# Patient Record
Sex: Male | Born: 1985 | Race: White | Hispanic: No | Smoking: Current every day smoker
Health system: Southern US, Community
[De-identification: ages and names within clinical notes are randomized; demographics above are authoritative.]

## PROBLEM LIST (undated history)

## (undated) DIAGNOSIS — N200 Calculus of kidney: Secondary | ICD-10-CM

## (undated) DIAGNOSIS — J45909 Unspecified asthma, uncomplicated: Secondary | ICD-10-CM

---

## 2006-01-18 ENCOUNTER — Emergency Department: Payer: Self-pay | Admitting: Emergency Medicine

## 2006-03-28 ENCOUNTER — Emergency Department: Payer: Self-pay | Admitting: Emergency Medicine

## 2007-08-24 ENCOUNTER — Emergency Department: Payer: Self-pay | Admitting: Emergency Medicine

## 2007-12-26 ENCOUNTER — Emergency Department: Payer: Self-pay | Admitting: Emergency Medicine

## 2009-07-31 ENCOUNTER — Emergency Department: Payer: Self-pay | Admitting: Emergency Medicine

## 2010-01-07 ENCOUNTER — Emergency Department: Payer: Self-pay | Admitting: Unknown Physician Specialty

## 2010-01-31 ENCOUNTER — Emergency Department: Payer: Self-pay | Admitting: Internal Medicine

## 2011-09-26 ENCOUNTER — Emergency Department: Payer: Self-pay | Admitting: Emergency Medicine

## 2011-09-26 LAB — CBC
HCT: 46 % (ref 40.0–52.0)
HGB: 15.5 g/dL (ref 13.0–18.0)
MCHC: 33.7 g/dL (ref 32.0–36.0)
MCV: 95 fL (ref 80–100)
Platelet: 256 10*3/uL (ref 150–440)
RBC: 4.84 10*6/uL (ref 4.40–5.90)
WBC: 9.5 10*3/uL (ref 3.8–10.6)

## 2011-09-26 LAB — COMPREHENSIVE METABOLIC PANEL
Albumin: 4.1 g/dL (ref 3.4–5.0)
Alkaline Phosphatase: 67 U/L (ref 50–136)
Anion Gap: 14 (ref 7–16)
BUN: 16 mg/dL (ref 7–18)
Bilirubin,Total: 0.3 mg/dL (ref 0.2–1.0)
Calcium, Total: 8.9 mg/dL (ref 8.5–10.1)
Chloride: 106 mmol/L (ref 98–107)
Creatinine: 1.09 mg/dL (ref 0.60–1.30)
EGFR (Non-African Amer.): >60
Glucose: 126 mg/dL — ABNORMAL HIGH (ref 65–99)
Potassium: 3.3 mmol/L — ABNORMAL LOW (ref 3.5–5.1)
SGOT(AST): 16 U/L (ref 15–37)
SGPT (ALT): 16 U/L
Total Protein: 6.9 g/dL (ref 6.4–8.2)

## 2011-09-26 LAB — LIPASE, BLOOD: Lipase: 80 U/L (ref 73–393)

## 2012-01-03 ENCOUNTER — Emergency Department: Payer: Self-pay | Admitting: Emergency Medicine

## 2014-02-07 ENCOUNTER — Emergency Department: Payer: Self-pay | Admitting: Emergency Medicine

## 2014-06-27 ENCOUNTER — Emergency Department: Payer: Self-pay | Admitting: Emergency Medicine

## 2014-06-27 LAB — CBC WITH DIFFERENTIAL/PLATELET
Basophil #: 0.1 10*3/uL (ref 0.0–0.1)
Basophil %: 0.8 %
EOS PCT: 4.4 %
Eosinophil #: 0.3 10*3/uL (ref 0.0–0.7)
HCT: 44.5 % (ref 40.0–52.0)
HGB: 14.7 g/dL (ref 13.0–18.0)
LYMPHS PCT: 24.1 %
Lymphocyte #: 1.8 10*3/uL (ref 1.0–3.6)
MCH: 31.7 pg (ref 26.0–34.0)
MCHC: 33 g/dL (ref 32.0–36.0)
MCV: 96 fL (ref 80–100)
MONO ABS: 0.6 x10 3/mm (ref 0.2–1.0)
Monocyte %: 7.3 %
Neutrophil #: 4.9 10*3/uL (ref 1.4–6.5)
Neutrophil %: 63.4 %
Platelet: 209 10*3/uL (ref 150–440)
RBC: 4.63 10*6/uL (ref 4.40–5.90)
RDW: 13 % (ref 11.5–14.5)
WBC: 7.6 10*3/uL (ref 3.8–10.6)

## 2014-06-27 LAB — COMPREHENSIVE METABOLIC PANEL
AST: 13 U/L — AB (ref 15–37)
Albumin: 3.8 g/dL (ref 3.4–5.0)
Alkaline Phosphatase: 64 U/L
Anion Gap: 7 (ref 7–16)
BILIRUBIN TOTAL: 0.4 mg/dL (ref 0.2–1.0)
BUN: 14 mg/dL (ref 7–18)
CALCIUM: 8.2 mg/dL — AB (ref 8.5–10.1)
CHLORIDE: 108 mmol/L — AB (ref 98–107)
Co2: 24 mmol/L (ref 21–32)
Creatinine: 0.97 mg/dL (ref 0.60–1.30)
EGFR (African American): >60
EGFR (Non-African Amer.): >60
GLUCOSE: 120 mg/dL — AB (ref 65–99)
Osmolality: 279 (ref 275–301)
Potassium: 4 mmol/L (ref 3.5–5.1)
SGPT (ALT): 16 U/L
Sodium: 139 mmol/L (ref 136–145)
Total Protein: 6.5 g/dL (ref 6.4–8.2)

## 2014-06-27 LAB — LIPASE, BLOOD: Lipase: 108 U/L (ref 73–393)

## 2014-07-24 ENCOUNTER — Emergency Department: Payer: Self-pay | Admitting: Student

## 2014-07-24 LAB — CBC WITH DIFFERENTIAL/PLATELET
BASOS ABS: 0.1 10*3/uL (ref 0.0–0.1)
BASOS PCT: 0.9 %
Eosinophil #: 0.3 10*3/uL (ref 0.0–0.7)
Eosinophil %: 5.1 %
HCT: 45.7 % (ref 40.0–52.0)
HGB: 15.3 g/dL (ref 13.0–18.0)
LYMPHS PCT: 26.7 %
Lymphocyte #: 1.6 10*3/uL (ref 1.0–3.6)
MCH: 31.9 pg (ref 26.0–34.0)
MCHC: 33.5 g/dL (ref 32.0–36.0)
MCV: 95 fL (ref 80–100)
MONO ABS: 0.6 x10 3/mm (ref 0.2–1.0)
Monocyte %: 9.1 %
Neutrophil #: 3.5 10*3/uL (ref 1.4–6.5)
Neutrophil %: 58.2 %
PLATELETS: 193 10*3/uL (ref 150–440)
RBC: 4.8 10*6/uL (ref 4.40–5.90)
RDW: 13.3 % (ref 11.5–14.5)
WBC: 6.1 10*3/uL (ref 3.8–10.6)

## 2014-07-24 LAB — URINALYSIS, COMPLETE
Bilirubin,UR: NEGATIVE
Blood: NEGATIVE
Glucose,UR: NEGATIVE mg/dL (ref 0–75)
KETONE: NEGATIVE
Leukocyte Esterase: NEGATIVE
Nitrite: NEGATIVE
PH: 7 (ref 4.5–8.0)
PROTEIN: NEGATIVE
RBC,UR: 3 /HPF (ref 0–5)
SPECIFIC GRAVITY: 1.023 (ref 1.003–1.030)
Squamous Epithelial: NONE SEEN

## 2014-07-24 LAB — COMPREHENSIVE METABOLIC PANEL
ALT: 14 U/L
AST: 18 U/L (ref 15–37)
Albumin: 3.7 g/dL (ref 3.4–5.0)
Alkaline Phosphatase: 67 U/L
Anion Gap: 6 — ABNORMAL LOW (ref 7–16)
BILIRUBIN TOTAL: 0.5 mg/dL (ref 0.2–1.0)
BUN: 13 mg/dL (ref 7–18)
CHLORIDE: 108 mmol/L — AB (ref 98–107)
CO2: 28 mmol/L (ref 21–32)
CREATININE: 0.89 mg/dL (ref 0.60–1.30)
Calcium, Total: 8.6 mg/dL (ref 8.5–10.1)
Glucose: 86 mg/dL (ref 65–99)
Osmolality: 283 (ref 275–301)
POTASSIUM: 3.9 mmol/L (ref 3.5–5.1)
SODIUM: 142 mmol/L (ref 136–145)
Total Protein: 6.6 g/dL (ref 6.4–8.2)

## 2014-07-24 LAB — LIPASE, BLOOD: LIPASE: 82 U/L (ref 73–393)

## 2014-07-26 LAB — URINE CULTURE

## 2014-10-23 ENCOUNTER — Emergency Department
Admit: 2014-10-23 | Disposition: A | Discharge status: Home / Self Care | Payer: Self-pay | Admitting: Emergency Medicine

## 2015-02-17 ENCOUNTER — Emergency Department: Payer: Managed Care, Other (non HMO)

## 2015-02-17 ENCOUNTER — Emergency Department
Admission: EM | Admit: 2015-02-17 | Discharge: 2015-02-17 | Disposition: A | Discharge status: Home / Self Care | Payer: Managed Care, Other (non HMO) | Attending: Emergency Medicine | Admitting: Emergency Medicine

## 2015-02-17 DIAGNOSIS — M545 Low back pain: Secondary | ICD-10-CM | POA: Insufficient documentation

## 2015-02-17 DIAGNOSIS — R109 Unspecified abdominal pain: Secondary | ICD-10-CM | POA: Diagnosis present

## 2015-02-17 DIAGNOSIS — N23 Unspecified renal colic: Secondary | ICD-10-CM | POA: Insufficient documentation

## 2015-02-17 LAB — URINALYSIS COMPLETE WITH MICROSCOPIC (ARMC ONLY)
BACTERIA UA: NONE SEEN
BILIRUBIN URINE: NEGATIVE
Glucose, UA: NEGATIVE mg/dL
Hgb urine dipstick: NEGATIVE
Leukocytes, UA: NEGATIVE
NITRITE: NEGATIVE
PH: 6 (ref 5.0–8.0)
Protein, ur: 30 mg/dL — AB
Specific Gravity, Urine: 1.032 — ABNORMAL HIGH (ref 1.005–1.030)

## 2015-02-17 LAB — BASIC METABOLIC PANEL
Anion gap: 7 (ref 5–15)
BUN: 21 mg/dL — AB (ref 6–20)
CALCIUM: 8.7 mg/dL — AB (ref 8.9–10.3)
CO2: 28 mmol/L (ref 22–32)
CREATININE: 0.74 mg/dL (ref 0.61–1.24)
Chloride: 107 mmol/L (ref 101–111)
GLUCOSE: 99 mg/dL (ref 65–99)
Potassium: 3.4 mmol/L — ABNORMAL LOW (ref 3.5–5.1)
Sodium: 142 mmol/L (ref 135–145)

## 2015-02-17 LAB — CBC WITH DIFFERENTIAL/PLATELET
BASOS ABS: 0.1 10*3/uL (ref 0–0.1)
Basophils Relative: 1 %
EOS ABS: 0.6 10*3/uL (ref 0–0.7)
Eosinophils Relative: 8 %
HCT: 39.1 % — ABNORMAL LOW (ref 40.0–52.0)
Hemoglobin: 13.7 g/dL (ref 13.0–18.0)
LYMPHS PCT: 39 %
Lymphs Abs: 3 10*3/uL (ref 1.0–3.6)
MCH: 33 pg (ref 26.0–34.0)
MCHC: 35.1 g/dL (ref 32.0–36.0)
MCV: 94 fL (ref 80.0–100.0)
MONO ABS: 0.6 10*3/uL (ref 0.2–1.0)
Monocytes Relative: 8 %
NEUTROS PCT: 44 %
Neutro Abs: 3.3 10*3/uL (ref 1.4–6.5)
Platelets: 196 10*3/uL (ref 150–440)
RBC: 4.15 MIL/uL — ABNORMAL LOW (ref 4.40–5.90)
RDW: 12.9 % (ref 11.5–14.5)
WBC: 7.5 10*3/uL (ref 3.8–10.6)

## 2015-02-17 MED ORDER — TAMSULOSIN HCL 0.4 MG PO CAPS: 0.4000 mg | ORAL_CAPSULE | Freq: Every day | ORAL | Status: DC

## 2015-02-17 MED ORDER — OXYCODONE-ACETAMINOPHEN 5-325 MG PO TABS: 1.0000 | ORAL_TABLET | Freq: Four times a day (QID) | ORAL | Status: DC | PRN

## 2015-02-17 MED ORDER — ONDANSETRON HCL 4 MG PO TABS: 4.0000 mg | ORAL_TABLET | Freq: Every day | ORAL | Status: DC | PRN

## 2015-02-17 NOTE — Discharge Instructions (Signed)

## 2015-02-17 NOTE — ED Notes (Signed)
Right flank pain x4-5 days , last seen at [facility] approx x1 month ago for similar symptoms, hx of kidney stone.

## 2015-02-17 NOTE — ED Notes (Signed)
Patient reports pain to right flank.  With history of kidney stones.  Patient eating chips and drinking soda while waiting and during triage.

## 2015-02-17 NOTE — ED Provider Notes (Signed)
[facility] Emergency Department Provider Note     Time seen: ----------------------------------------- 7:11 AM on 02/17/2015 -----------------------------------------    I have reviewed the triage vital signs and the nursing notes.   HISTORY  Chief Complaint Flank Pain    HPI Jamie Hudson is a 29 y.o. male who presents ER with right flank pain. Patient states he was working and had to stop due to the pain. It's sharp, comes and goes. He is in no acute distress here on arrival. States he was seen at [facility] about a month ago was diagnosed with kidney stones. Patient states the pain earlier was 12/10.   No past medical history on file.  There are no active problems to display for this patient.   No past surgical history on file.  Allergies Review of patient's allergies indicates not on file.  Social History History  Substance Use Topics  . Smoking status: Not on file  . Smokeless tobacco: Not on file  . Alcohol Use: Not on file    Review of Systems Constitutional: Negative for fever. Eyes: Negative for visual changes. ENT: Negative for sore throat. Cardiovascular: Negative for chest pain. Respiratory: Negative for shortness of breath. Gastrointestinal: Positive for abdominal pain Genitourinary: Negative for dysuria. Musculoskeletal: Positive for right-sided low back pain Skin: Negative for rash. Neurological: Negative for headaches, focal weakness or numbness.  10-point ROS otherwise negative.  ____________________________________________   PHYSICAL EXAM:  VITAL SIGNS: ED Triage Vitals  Enc Vitals Group     BP 02/17/15 0312 110/70 mmHg     Pulse Rate 02/17/15 0312 74     Resp 02/17/15 0312 20     Temp 02/17/15 0312 98.4 F (36.9 C)     Temp Source 02/17/15 0312 Oral     SpO2 02/17/15 0312 98 %     Weight 02/17/15 0312 136 lb (61.689 kg)     Height 02/17/15 0312 5\' 9"  (1.753 m)     Head Cir --      Peak Flow --      Pain  Score 02/17/15 0312 6     Pain Loc --      Pain Edu? --      Excl. in GC? --     Constitutional: Alert and oriented. Well appearing and in no distress. Eyes: Conjunctivae are normal. PERRL. Normal extraocular movements. ENT   Head: Normocephalic and atraumatic.   Nose: No congestion/rhinnorhea.   Mouth/Throat: Mucous membranes are moist.   Neck: No stridor. Cardiovascular: Normal rate, regular rhythm. Normal and symmetric distal pulses are present in all extremities. No murmurs, rubs, or gallops. Respiratory: Normal respiratory effort without tachypnea nor retractions. Breath sounds are clear and equal bilaterally. No wheezes/rales/rhonchi. Gastrointestinal: Soft and nontender. No distention. No abdominal bruits.  Musculoskeletal: Nontender with normal range of motion in all extremities. No joint effusions.  No lower extremity tenderness nor edema. Neurologic:  Normal speech and language. No gross focal neurologic deficits are appreciated. Speech is normal. No gait instability. Skin:  Skin is warm, dry and intact. No rash noted. Psychiatric: Mood and affect are normal. Speech and behavior are normal. Patient exhibits appropriate insight and judgment. ____________________________________________  ED COURSE:  Pertinent labs & imaging results that were available during my care of the patient were reviewed by me and considered in my medical decision making (see chart for details). We'll check basic labs, urine and reevaluate. ____________________________________________    LABS (pertinent positives/negatives)  Labs Reviewed  URINALYSIS COMPLETEWITH MICROSCOPIC ([facility] ONLY) -  Abnormal; Notable for the following:    Color, Urine AMBER (*)    APPearance CLEAR (*)    Ketones, ur TRACE (*)    Specific Gravity, Urine 1.032 (*)    Protein, ur 30 (*)    Squamous Epithelial / LPF 0-5 (*)    All other components within normal limits  CBC WITH DIFFERENTIAL/PLATELET - Abnormal;  Notable for the following:    RBC 4.15 (*)    HCT 39.1 (*)    All other components within normal limits  BASIC METABOLIC PANEL - Abnormal; Notable for the following:    Potassium 3.4 (*)    BUN 21 (*)    Calcium 8.7 (*)    All other components within normal limits   IMPRESSION: 1. Possible 2 mm calculus in the right hemipelvis could be within the distal right ureter or right side of the urinary bladder. This could be better evaluated with noncontrast CT scan of the abdomen and pelvis if clinically appropriate. 2. Nonobstructive bowel gas pattern. 3. No pneumoperitoneum. ____________________________________________  FINAL ASSESSMENT AND PLAN  Flank pain  Plan: Patient with labs and imaging as dictated above. Possible renal colic, patient will be discharged with Flomax, Percocet and nausea medicine. We'll refer him to urology for follow-up   Emily Filbert, MD   Emily Filbert, MD 02/17/15 [id number]

## 2015-02-26 ENCOUNTER — Encounter: Payer: Self-pay | Admitting: Urgent Care

## 2015-02-26 ENCOUNTER — Emergency Department
Admission: EM | Admit: 2015-02-26 | Discharge: 2015-02-26 | Disposition: A | Discharge status: Home / Self Care | Payer: Managed Care, Other (non HMO) | Attending: Emergency Medicine | Admitting: Emergency Medicine

## 2015-02-26 DIAGNOSIS — Z720 Tobacco use: Secondary | ICD-10-CM | POA: Diagnosis not present

## 2015-02-26 DIAGNOSIS — Z79899 Other long term (current) drug therapy: Secondary | ICD-10-CM | POA: Diagnosis not present

## 2015-02-26 DIAGNOSIS — R109 Unspecified abdominal pain: Secondary | ICD-10-CM | POA: Diagnosis present

## 2015-02-26 DIAGNOSIS — N201 Calculus of ureter: Secondary | ICD-10-CM | POA: Insufficient documentation

## 2015-02-26 HISTORY — DX: Calculus of kidney: N20.0

## 2015-02-26 HISTORY — DX: Unspecified asthma, uncomplicated: J45.909

## 2015-02-26 LAB — URINALYSIS COMPLETE WITH MICROSCOPIC (ARMC ONLY)
BILIRUBIN URINE: NEGATIVE
Bacteria, UA: NONE SEEN
GLUCOSE, UA: NEGATIVE mg/dL
Hgb urine dipstick: NEGATIVE
KETONES UR: NEGATIVE mg/dL
Leukocytes, UA: NEGATIVE
Nitrite: NEGATIVE
Protein, ur: NEGATIVE mg/dL
SQUAMOUS EPITHELIAL / LPF: NONE SEEN
Specific Gravity, Urine: 1.016 (ref 1.005–1.030)
pH: 7 (ref 5.0–8.0)

## 2015-02-26 MED ORDER — OXYCODONE-ACETAMINOPHEN 5-325 MG PO TABS: 1.0000 | ORAL_TABLET | Freq: Four times a day (QID) | ORAL | Status: DC | PRN

## 2015-02-26 NOTE — ED Provider Notes (Signed)
[facility] Emergency Department Provider Note  Time seen: 8:50 PM  I have reviewed the triage vital signs and the nursing notes.   HISTORY  Chief Complaint Flank Pain    HPI PIER BOSHER is a 29 y.o. male with a past medical history of asthma and kidney stones who presents the emergency department right flank pain. According to the patient he's had intermittent right flank pain for the past 1.5-2 months. Patient was seen at [facility] the beginning of July for the same, with a CT scan showing bilateral kidney stones. He was seen at [facility] approximately one week ago with an x-ray consistent with a 2 mm stone. Patient has an appointment with Dr. Sheppard Penton on 8/26. Patient states he is out of pain medication, and the pain has returned. Denies any fever, dysuria, nausea or vomiting. Patient states this pain feels like his typical kidney stone pain. Describes the pain as moderate to severe at maximum, currently moderate. Sharp/stabbing right flank pain. Denies any modifying factors.     Past Medical History  Diagnosis Date  . Asthma   . Kidney stones     There are no active problems to display for this patient.   History reviewed. No pertinent past surgical history.  Current Outpatient Rx  Name  Route  Sig  Dispense  Refill  . ondansetron (ZOFRAN) 4 MG tablet   Oral   Take 1 tablet (4 mg total) by mouth daily as needed for nausea or vomiting.   20 tablet   1   . oxyCODONE-acetaminophen (ROXICET) 5-325 MG per tablet   Oral   Take 1 tablet by mouth every 6 (six) hours as needed.   20 tablet   0   . tamsulosin (FLOMAX) 0.4 MG CAPS capsule   Oral   Take 1 capsule (0.4 mg total) by mouth daily after breakfast.   30 capsule   0     Allergies Review of patient's allergies indicates no known allergies.  No family history on file.  Social History Social History  Substance Use Topics  . Smoking status: Current Every Day Smoker  .  Smokeless tobacco: None  . Alcohol Use: No    Review of Systems Constitutional: Negative for fever. Cardiovascular: Negative for chest pain. Respiratory: Negative for shortness of breath. Gastrointestinal: Positive right flank pain Genitourinary: Negative for dysuria. Musculoskeletal: Positive right back pain Neurological: Negative for headache 10-point ROS otherwise negative.  ____________________________________________   PHYSICAL EXAM:  VITAL SIGNS: ED Triage Vitals  Enc Vitals Group     BP 02/26/15 1924 110/67 mmHg     Pulse Rate 02/26/15 1924 96     Resp 02/26/15 1924 18     Temp 02/26/15 1924 98.6 F (37 C)     Temp Source 02/26/15 1924 Oral     SpO2 02/26/15 1924 98 %     Weight 02/26/15 1924 140 lb (63.504 kg)     Height 02/26/15 1924 5\' 9"  (1.753 m)     Head Cir --      Peak Flow --      Pain Score 02/26/15 1924 6     Pain Loc --      Pain Edu? --      Excl. in GC? --     Constitutional: Alert and oriented. Well appearing and in no distress. Eyes: Normal exam ENT   Head: Normocephalic and atraumatic Cardiovascular: Normal rate, regular rhythm. No murmurs, rubs, or gallops. Respiratory: Normal respiratory  effort without tachypnea nor retractions. Breath sounds are clear and equal bilaterally. No wheezes/rales/rhonchi. Gastrointestinal: Soft and nontender. No distention.  Moderate CVA tenderness palpation. Musculoskeletal: Nontender with normal range of motion in all extremities. Neurologic:  Normal speech and language. No gross focal neurologic deficits are appreciated. Speech is normal. Skin:  Skin is warm, dry and intact.  Psychiatric: Mood and affect are normal. Speech and behavior are normal. Patient exhibits appropriate insight and judgment.  ____________________________________________     INITIAL IMPRESSION / ASSESSMENT AND PLAN / ED COURSE  Pertinent labs & imaging results that were available during my care of the patient were reviewed by me  and considered in my medical decision making (see chart for details).  Patient with right flank pain consistent with prior stones. Patient had a CT scan on 01/19/15 at [facility] showing bilateral kidney stones. X-ray performed approximately one week ago showing likely 2 mm stone. Patient has an appointment with urology in 1 week. We will discharge the patient home with a short course of pain medication. I discussed with the patient the use of NSAIDs, and only using Percocet as needed for severe pain. The patient is agreeable. Discussed strict return precautions with the patient which she is agreeable.  ____________________________________________   FINAL CLINICAL IMPRESSION(S) / ED DIAGNOSES  Right flank pain Ureterolithiasis   Minna Antis, MD 02/26/15 2053

## 2015-02-26 NOTE — ED Notes (Signed)
Strainer and cup given to patient.

## 2015-02-26 NOTE — ED Notes (Signed)
Patient presents with c/o RIGHT flank pain since yesterday. Denies N/V and dysuria; reports that his urine is "dark" with no gross hematuria appreciated. Patient was here x 3 weeks ago for the same. Has urology appt on the 8/26.

## 2015-02-26 NOTE — Discharge Instructions (Signed)

## 2015-03-09 ENCOUNTER — Encounter: Payer: Self-pay | Admitting: Urology

## 2015-03-09 ENCOUNTER — Ambulatory Visit (INDEPENDENT_AMBULATORY_CARE_PROVIDER_SITE_OTHER): Payer: Managed Care, Other (non HMO) | Admitting: Urology

## 2015-03-09 VITALS — BP 117/80 | HR 77 | Resp 18 | Ht 69.0 in | Wt 138.5 lb

## 2015-03-09 DIAGNOSIS — N200 Calculus of kidney: Secondary | ICD-10-CM | POA: Insufficient documentation

## 2015-03-09 MED ORDER — TAMSULOSIN HCL 0.4 MG PO CAPS: 0.4000 mg | ORAL_CAPSULE | Freq: Every day | ORAL | Status: DC

## 2015-03-09 MED ORDER — OXYCODONE-ACETAMINOPHEN 5-325 MG PO TABS: 1.0000 | ORAL_TABLET | Freq: Four times a day (QID) | ORAL | Status: DC | PRN

## 2015-03-09 NOTE — Progress Notes (Signed)
03/09/2015 11:05 AM   Jamie Hudson 10/21/1985 [id number]  Referring provider: No referring provider defined for this encounter.  Chief Complaint  Patient presents with  . Nephrolithiasis    HPI:  1 - Recurrent Nephrolithiasis -  Pre 2016  Medical passage x many 02/2015 - Rt ureteral stone 2mm by ER KUB on eval right flank pain.   PMH sig for oral surgery. No CV disase.  Today "Jamie Hudson" is seen as eval for new ureteral stone. His pain is controlled and he is on tamuslosin for medical passage. No fevers.        PMH: Past Medical History  Diagnosis Date  . Asthma   . Kidney stones     Surgical History: No past surgical history on file.  Home Medications:    Medication List       This list is accurate as of: 03/09/15 11:05 AM.  Always use your most recent med list.               albuterol 0.63 MG/3ML nebulizer solution  Commonly known as:  ACCUNEB  Inhale 1 ampule by nebulization every six (6) hours as needed for wheezing.     ibuprofen 200 MG tablet  Commonly known as:  ADVIL,MOTRIN  Take 200 mg by mouth every 6 (six) hours as needed.     ondansetron 4 MG tablet  Commonly known as:  ZOFRAN  Take 1 tablet (4 mg total) by mouth daily as needed for nausea or vomiting.     oxyCODONE-acetaminophen 5-325 MG per tablet  Commonly known as:  ROXICET  Take 1 tablet by mouth every 6 (six) hours as needed for moderate pain. From kidney stone     tamsulosin 0.4 MG Caps capsule  Commonly known as:  FLOMAX  Take 1 capsule (0.4 mg total) by mouth daily after breakfast. To help pass kidney stone        Allergies: No Known Allergies  Family History: Family History  Problem Relation Age of Onset  . Cancer Maternal Aunt   . Kidney failure Maternal Grandmother     Social History:  reports that he has been smoking.  He does not have any smokeless tobacco history on file. He reports that he does not drink alcohol. His drug history is not on  file.  ROS: UROLOGY Frequent Urination?: No Hard to postpone urination?: Yes Burning/pain with urination?: Yes Get up at night to urinate?: No Leakage of urine?: No Urine stream starts and stops?: Yes Trouble starting stream?: No Do you have to strain to urinate?: Yes Blood in urine?: No Urinary tract infection?: No Sexually transmitted disease?: No Injury to kidneys or bladder?: No Painful intercourse?: No Weak stream?: No Erection problems?: No Penile pain?: Yes  Gastrointestinal Nausea?: No Vomiting?: No Indigestion/heartburn?: Yes Diarrhea?: No Constipation?: No  Constitutional Fever: No Night sweats?: No Weight loss?: No Fatigue?: Yes  Skin Skin rash/lesions?: No Itching?: Yes  Eyes Blurred vision?: No Double vision?: No  Ears/Nose/Throat Sore throat?: No Sinus problems?: Yes  Hematologic/Lymphatic Swollen glands?: No Easy bruising?: No  Cardiovascular Leg swelling?: No Chest pain?: Yes  Respiratory Cough?: No Shortness of breath?: No  Endocrine Excessive thirst?: No  Musculoskeletal Back pain?: Yes Joint pain?: Yes  Neurological Headaches?: Yes Dizziness?: No  Psychologic Depression?: No Anxiety?: No  Physical Exam: BP 117/80 mmHg  Pulse 77  Resp 18  Ht  (1.753 m)  Wt 138 lb 8 oz (62.823 kg)  BMI 20.44 kg/m2  Constitutional:  Alert and oriented, No acute distress. HEENT: [place] AT, moist mucus membranes.  Trachea midline, no masses. Cardiovascular: No clubbing, cyanosis, or edema. Respiratory: Normal respiratory effort, no increased work of breathing. GI: Abdomen is soft, nontender, nondistended, no abdominal masses GU: Very mild Rt CVAT.  Skin: No rashes, bruises or suspicious lesions. Lymph: No cervical or inguinal adenopathy. Neurologic: Grossly intact, no focal deficits, moving all 4 extremities. Psychiatric: Normal mood and affect.  Laboratory Data: Lab Results  Component Value Date   WBC 7.5 02/17/2015   HGB  13.7 02/17/2015   HCT 39.1* 02/17/2015   MCV 94.0 02/17/2015   PLT 196 02/17/2015    Lab Results  Component Value Date   CREATININE 0.74 02/17/2015    No results found for: PSA  No results found for: TESTOSTERONE  No results found for: HGBA1C  Urinalysis    Component Value Date/Time   COLORURINE YELLOW* 02/26/2015 1927   COLORURINE Yellow 07/24/2014 1301   APPEARANCEUR CLEAR* 02/26/2015 1927   APPEARANCEUR Cloudy 07/24/2014 1301   LABSPEC 1.016 02/26/2015 1927   LABSPEC 1.023 07/24/2014 1301   PHURINE 7.0 02/26/2015 1927   PHURINE 7.0 07/24/2014 1301   GLUCOSEU NEGATIVE 02/26/2015 1927   GLUCOSEU Negative 07/24/2014 1301   HGBUR NEGATIVE 02/26/2015 1927   HGBUR Negative 07/24/2014 1301   BILIRUBINUR NEGATIVE 02/26/2015 1927   BILIRUBINUR Negative 07/24/2014 1301   KETONESUR NEGATIVE 02/26/2015 1927   KETONESUR Negative 07/24/2014 1301   PROTEINUR NEGATIVE 02/26/2015 1927   PROTEINUR Negative 07/24/2014 1301   NITRITE NEGATIVE 02/26/2015 1927   NITRITE Negative 07/24/2014 1301   LEUKOCYTESUR NEGATIVE 02/26/2015 1927   LEUKOCYTESUR Negative 07/24/2014 1301    Pertinent Imaging:   Assessment & Plan:    1. Kidney stones - minimal stone burden and pain controlled. Rec continue medical therapy and he is i nagreement. Refilled tamsulosin and percocet today. He understands to contact MD if fevers arise.  2 - RTC 1 mo with KUB or sooner if refractory colic.    No Follow-up on file.  Sebastian Ache, MD  [facility] [address] [place], [place] [address] [phone]

## 2015-03-12 ENCOUNTER — Telehealth: Payer: Self-pay

## 2015-03-12 NOTE — Telephone Encounter (Signed)
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 Per Lillia Abed pt can return to work but can not take pain medications while at work. HR voiced understanding.

## 2015-03-20 ENCOUNTER — Telehealth: Payer: Self-pay | Admitting: Urology

## 2015-03-20 NOTE — Telephone Encounter (Signed)
Patient requested FMLA paperwork to be completed for his employer.  Dr. Berneice Heinrich advised that the patient does not need to be out of work and will not complete FMLA paperwork.  Patient has been advised.

## 2015-03-28 ENCOUNTER — Encounter: Payer: Self-pay | Admitting: Emergency Medicine

## 2015-03-28 ENCOUNTER — Emergency Department
Admission: EM | Admit: 2015-03-28 | Discharge: 2015-03-28 | Disposition: A | Discharge status: Home / Self Care | Payer: Managed Care, Other (non HMO) | Attending: Emergency Medicine | Admitting: Emergency Medicine

## 2015-03-28 DIAGNOSIS — Y9289 Other specified places as the place of occurrence of the external cause: Secondary | ICD-10-CM | POA: Insufficient documentation

## 2015-03-28 DIAGNOSIS — Z79899 Other long term (current) drug therapy: Secondary | ICD-10-CM | POA: Insufficient documentation

## 2015-03-28 DIAGNOSIS — Y998 Other external cause status: Secondary | ICD-10-CM | POA: Insufficient documentation

## 2015-03-28 DIAGNOSIS — Y9389 Activity, other specified: Secondary | ICD-10-CM | POA: Insufficient documentation

## 2015-03-28 DIAGNOSIS — S50862A Insect bite (nonvenomous) of left forearm, initial encounter: Secondary | ICD-10-CM | POA: Diagnosis present

## 2015-03-28 DIAGNOSIS — W57XXXA Bitten or stung by nonvenomous insect and other nonvenomous arthropods, initial encounter: Secondary | ICD-10-CM | POA: Insufficient documentation

## 2015-03-28 DIAGNOSIS — Z720 Tobacco use: Secondary | ICD-10-CM | POA: Diagnosis not present

## 2015-03-28 NOTE — Discharge Instructions (Signed)
Insect Bite Mosquitoes, flies, fleas, bedbugs, and other insects can bite. Insect bites are different from insect stings. The bite may be red, puffy (swollen), and itchy for 2 to 4 days. Most bites get better on their own. HOME CARE   Do not scratch the bite.  Keep the bite clean and dry. Wash the bite with soap and water.  Put ice on the bite.  Put ice in a plastic bag.  Place a towel between your skin and the bag.  Leave the ice on for 20 minutes, 4 times a day. Do this for the first 2 to 3 days, or as told by your doctor.  You may use medicated lotions or creams to lessen itching as told by your doctor.  Only take medicines as told by your doctor.  If you are given medicines (antibiotics), take them as told. Finish them even if you start to feel better. You may need a tetanus shot if:  You cannot remember when you had your last tetanus shot.  You have never had a tetanus shot.  The injury broke your skin. If you need a tetanus shot and you choose not to have one, you may get tetanus. Sickness from tetanus can be serious. GET HELP RIGHT AWAY IF:   You have more pain, redness, or puffiness.  You see a red line on the skin coming from the bite.  You have a fever.  You have joint pain.  You have a headache or neck pain.  You feel weak.  You have a rash.  You have chest pain, or you are short of breath.  You have belly (abdominal) pain.  You feel sick to your stomach (nauseous) or throw up (vomit).  You feel very tired or sleepy. MAKE SURE YOU:   Understand these instructions.  Will watch your condition.  Will get help right away if you are not doing well or get worse. Document Released: 06/27/2000 Document Revised: 09/22/2011 Document Reviewed: 01/29/2011 ExitCare Patient Information 2015 [employer]. This information is not intended to replace advice given to you by your health care provider. Make sure you discuss any questions you have with your health  care provider.  

## 2015-03-28 NOTE — ED Notes (Signed)
Patient to ED with report of spider bite to left forearm. Patient reports some arm pain since then.

## 2015-03-28 NOTE — ED Provider Notes (Signed)
[facility] Emergency Department Provider Note ____________________________________________  Time seen: 1750  I have reviewed the triage vital signs and the nursing notes.  HISTORY  Chief Complaint  Insect Bite  HPI Jamie Hudson is a 29 y.o. male reports to the ED for evaluation of a spider bite left forearm. 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 Since that time he has applied antibiotic ointment and taken some leftover antibiotics. He has no complaint at this time except some intermittent itching to the site. He denies any fever, chills, sweats, or distal paresthesias. He is without any indication of infection to the bite site.  Past Medical History  Diagnosis Date  . Asthma   . Kidney stones     Patient Active Problem List   Diagnosis Date Noted  . Recurrent nephrolithiasis 03/09/2015    History reviewed. No pertinent past surgical history.  Current Outpatient Rx  Name  Route  Sig  Dispense  Refill  . albuterol (ACCUNEB) 0.63 MG/3ML nebulizer solution      Inhale 1 ampule by nebulization every six (6) hours as needed for wheezing.         [name] ibuprofen (ADVIL,MOTRIN) 200 MG tablet   Oral   Take 200 mg by mouth every 6 (six) hours as needed.         . ondansetron (ZOFRAN) 4 MG tablet   Oral   Take 1 tablet (4 mg total) by mouth daily as needed for nausea or vomiting.   20 tablet   1   . oxyCODONE-acetaminophen (ROXICET) 5-325 MG per tablet   Oral   Take 1 tablet by mouth every 6 (six) hours as needed for moderate pain. From kidney stone   40 tablet   0   . tamsulosin (FLOMAX) 0.4 MG CAPS capsule   Oral   Take 1 capsule (0.4 mg total) by mouth daily after breakfast. To help pass kidney stone   30 capsule   1    Allergies Review of patient's allergies indicates no known allergies.  Family History  Problem Relation Age of Onset  . Cancer Maternal Aunt   . Kidney failure  Maternal Grandmother     Social History Social History  Substance Use Topics  . Smoking status: Current Every Day Smoker  . Smokeless tobacco: None  . Alcohol Use: No   Review of Systems  Constitutional: Negative for fever. Eyes: Negative for visual changes. ENT: Negative for sore throat. Cardiovascular: Negative for chest pain. Respiratory: Negative for shortness of breath. Gastrointestinal: Negative for abdominal pain, vomiting and diarrhea. Genitourinary: Negative for dysuria. Musculoskeletal: Negative for back pain. Skin: Negative for rash. Insect bite as above Neurological: Negative for headaches, focal weakness or numbness. ____________________________________________  PHYSICAL EXAM:  VITAL SIGNS: ED Triage Vitals  Enc Vitals Group     BP 03/28/15 1623 112/70 mmHg     Pulse Rate 03/28/15 1623 106     Resp 03/28/15 1623 20     Temp 03/28/15 1623 98.4 F (36.9 C)     Temp Source 03/28/15 1623 Oral     SpO2 03/28/15 1623 97 %     Weight 03/28/15 1623 130 lb (58.968 kg)     Height 03/28/15 1623  (1.753 m)     Head Cir --      Peak Flow --      Pain Score 03/28/15 1623 5     Pain Loc --  Pain Edu? --      Excl. in GC? --    Constitutional: Alert and oriented. Well appearing and in no distress. Eyes: Conjunctivae are normal. PERRL. Normal extraocular movements. ENT   Head: Normocephalic and atraumatic.   Nose: No congestion/rhinorrhea.   Mouth/Throat: Mucous membranes are moist.   Neck: Supple. No thyromegaly. Hematological/Lymphatic/Immunological: No cervical lymphadenopathy. Cardiovascular: Normal rate, regular rhythm.  Respiratory: Normal respiratory effort. No wheezes/rales/rhonchi. Gastrointestinal: Soft and nontender. No distention. Musculoskeletal: Nontender with normal range of motion in all extremities.  Neurologic:  Normal gait without ataxia. Normal speech and language. No gross focal neurologic deficits are appreciated. Skin:   Skin is warm, dry and intact. No rash noted. Single erythematous papule to the left forearm without signs of infection, lymphangitis, induration, excoriations, or puncture.  Psychiatric: Mood and affect are normal. Patient exhibits appropriate insight and judgment. ____________________________________________  INITIAL IMPRESSION / ASSESSMENT AND PLAN / ED COURSE  Uninfected spider bite without local skin changes or signs of allergic reaction. Reassurance to the patient about treatment with topical cortisone cream. Our primary care provider as needed. ____________________________________________  FINAL CLINICAL IMPRESSION(S) / ED DIAGNOSES  Final diagnoses:  Insect bite      Lissa Hoard, PA-C 03/28/15 1855  Minna Antis, MD 03/28/15 2056

## 2015-04-10 ENCOUNTER — Encounter: Payer: Self-pay | Admitting: Urology

## 2015-04-10 ENCOUNTER — Ambulatory Visit: Payer: Managed Care, Other (non HMO) | Admitting: Urology

## 2015-05-26 ENCOUNTER — Emergency Department
Admission: EM | Admit: 2015-05-26 | Discharge: 2015-05-26 | Disposition: A | Discharge status: Home / Self Care | Payer: Managed Care, Other (non HMO) | Attending: Emergency Medicine | Admitting: Emergency Medicine

## 2015-05-26 ENCOUNTER — Emergency Department: Payer: Managed Care, Other (non HMO)

## 2015-05-26 DIAGNOSIS — Z720 Tobacco use: Secondary | ICD-10-CM | POA: Insufficient documentation

## 2015-05-26 DIAGNOSIS — N201 Calculus of ureter: Secondary | ICD-10-CM | POA: Insufficient documentation

## 2015-05-26 LAB — URINALYSIS COMPLETE WITH MICROSCOPIC (ARMC ONLY)
BACTERIA UA: NONE SEEN
Bilirubin Urine: NEGATIVE
GLUCOSE, UA: NEGATIVE mg/dL
KETONES UR: NEGATIVE mg/dL
Leukocytes, UA: NEGATIVE
NITRITE: NEGATIVE
Protein, ur: NEGATIVE mg/dL
SPECIFIC GRAVITY, URINE: 1.013 (ref 1.005–1.030)
pH: 9 — ABNORMAL HIGH (ref 5.0–8.0)

## 2015-05-26 LAB — CBC WITH DIFFERENTIAL/PLATELET
Basophils Absolute: 0.1 10*3/uL (ref 0–0.1)
Basophils Relative: 1 %
EOS ABS: 0.1 10*3/uL (ref 0–0.7)
EOS PCT: 0 %
HCT: 43 % (ref 40.0–52.0)
Hemoglobin: 14.6 g/dL (ref 13.0–18.0)
LYMPHS ABS: 1.1 10*3/uL (ref 1.0–3.6)
Lymphocytes Relative: 7 %
MCH: 31.2 pg (ref 26.0–34.0)
MCHC: 33.9 g/dL (ref 32.0–36.0)
MCV: 92 fL (ref 80.0–100.0)
MONO ABS: 1 10*3/uL (ref 0.2–1.0)
Monocytes Relative: 6 %
Neutro Abs: 15.1 10*3/uL — ABNORMAL HIGH (ref 1.4–6.5)
Neutrophils Relative %: 86 %
Platelets: 244 10*3/uL (ref 150–440)
RBC: 4.67 MIL/uL (ref 4.40–5.90)
RDW: 12.4 % (ref 11.5–14.5)
WBC: 17.3 10*3/uL — ABNORMAL HIGH (ref 3.8–10.6)

## 2015-05-26 LAB — COMPREHENSIVE METABOLIC PANEL
ALT: 12 U/L — ABNORMAL LOW (ref 17–63)
ANION GAP: 10 (ref 5–15)
AST: 21 U/L (ref 15–41)
Albumin: 4.6 g/dL (ref 3.5–5.0)
Alkaline Phosphatase: 51 U/L (ref 38–126)
BUN: 15 mg/dL (ref 6–20)
CALCIUM: 9.6 mg/dL (ref 8.9–10.3)
CHLORIDE: 103 mmol/L (ref 101–111)
CO2: 24 mmol/L (ref 22–32)
CREATININE: 0.89 mg/dL (ref 0.61–1.24)
GFR calc Af Amer: >60 mL/min (ref >60)
Glucose, Bld: 115 mg/dL — ABNORMAL HIGH (ref 65–99)
Potassium: 3.8 mmol/L (ref 3.5–5.1)
SODIUM: 137 mmol/L (ref 135–145)
Total Bilirubin: 1 mg/dL (ref 0.3–1.2)
Total Protein: 7.5 g/dL (ref 6.5–8.1)

## 2015-05-26 LAB — LIPASE, BLOOD: LIPASE: 21 U/L (ref 11–51)

## 2015-05-26 MED ORDER — TAMSULOSIN HCL 0.4 MG PO CAPS: 0.4000 mg | ORAL_CAPSULE | Freq: Every day | ORAL | Status: DC

## 2015-05-26 MED ORDER — SODIUM CHLORIDE 0.9 % IV BOLUS (SEPSIS): 1000.0000 mL | Freq: Once | INTRAVENOUS | Status: DC

## 2015-05-26 MED ORDER — SODIUM CHLORIDE 0.9 % IV BOLUS (SEPSIS)
1000.0000 mL | Freq: Once | INTRAVENOUS | Status: AC
  Administered 2015-05-26: 1000 mL via INTRAVENOUS

## 2015-05-26 MED ORDER — ONDANSETRON HCL 4 MG/2ML IJ SOLN
4.0000 mg | Freq: Once | INTRAMUSCULAR | Status: AC
  Administered 2015-05-26: 4 mg via INTRAVENOUS
  Filled 2015-05-26: qty 2

## 2015-05-26 MED ORDER — KETOROLAC TROMETHAMINE 30 MG/ML IJ SOLN
30.0000 mg | Freq: Once | INTRAMUSCULAR | Status: AC
  Administered 2015-05-26: 30 mg via INTRAVENOUS
  Filled 2015-05-26: qty 1

## 2015-05-26 MED ORDER — OXYCODONE-ACETAMINOPHEN 5-325 MG PO TABS: 1.0000 | ORAL_TABLET | Freq: Four times a day (QID) | ORAL | Status: DC | PRN

## 2015-05-26 MED ORDER — MORPHINE SULFATE (PF) 4 MG/ML IV SOLN
4.0000 mg | Freq: Once | INTRAVENOUS | Status: AC
  Administered 2015-05-26: 4 mg via INTRAVENOUS
  Filled 2015-05-26: qty 1

## 2015-05-26 MED ORDER — TAMSULOSIN HCL 0.4 MG PO CAPS
0.4000 mg | ORAL_CAPSULE | Freq: Once | ORAL | Status: AC
  Administered 2015-05-26: 0.4 mg via ORAL
  Filled 2015-05-26: qty 1

## 2015-05-26 NOTE — ED Notes (Signed)
Patient transported to Ultrasound 

## 2015-05-26 NOTE — ED Provider Notes (Signed)
[facility] Emergency Department Provider Note  ____________________________________________  Time seen: Approximately 515 AM  I have reviewed the triage vital signs and the nursing notes.   HISTORY  Chief Complaint Flank Pain    HPI Jamie Hudson is a 29 y.o. male with a history of kidney stones who is presenting today with left-sided flank pain that started at 11 PM yesterday. He said that he was coming home in a car when he had sudden onset sharp left sided flank pain. He said that his urine has since been dark. He has had nausea and vomiting as well as 1 episode of diarrhea since the event. He says that he had similar symptoms with a previous kidney stone was seen in [facility] neurologic. He did not need any intervention and was able to pass the stone with Flomax.   Past Medical History  Diagnosis Date  . Asthma   . Kidney stones     Patient Active Problem List   Diagnosis Date Noted  . Recurrent nephrolithiasis 03/09/2015    No past surgical history on file.  Current Outpatient Rx  Name  Route  Sig  Dispense  Refill  . albuterol (ACCUNEB) 0.63 MG/3ML nebulizer solution      Inhale 1 ampule by nebulization every six (6) hours as needed for wheezing.         [name] ibuprofen (ADVIL,MOTRIN) 200 MG tablet   Oral   Take 200 mg by mouth every 6 (six) hours as needed.         . ondansetron (ZOFRAN) 4 MG tablet   Oral   Take 1 tablet (4 mg total) by mouth daily as needed for nausea or vomiting.   20 tablet   1   . oxyCODONE-acetaminophen (ROXICET) 5-325 MG per tablet   Oral   Take 1 tablet by mouth every 6 (six) hours as needed for moderate pain. From kidney stone   40 tablet   0   . tamsulosin (FLOMAX) 0.4 MG CAPS capsule   Oral   Take 1 capsule (0.4 mg total) by mouth daily after breakfast. To help pass kidney stone   30 capsule   1     Allergies Review of patient's allergies indicates no known allergies.  Family History   Problem Relation Age of Onset  . Cancer Maternal Aunt   . Kidney failure Maternal Grandmother     Social History Social History  Substance Use Topics  . Smoking status: Current Every Day Smoker -- 1.00 packs/day for 15 years    Types: Cigarettes  . Smokeless tobacco: None  . Alcohol Use: No    Review of Systems Constitutional: No fever/chills Eyes: No visual changes. ENT: No sore throat. Cardiovascular: Denies chest pain. Respiratory: Denies shortness of breath. Gastrointestinal:  No constipation. Genitourinary: Negative for dysuria. Musculoskeletal: Negative for back pain. Skin: Negative for rash. Neurological: Negative for headaches, focal weakness or numbness.  10-point ROS otherwise negative.  ____________________________________________   PHYSICAL EXAM:  VITAL SIGNS: ED Triage Vitals  Enc Vitals Group     BP 05/26/15 0459 110/76 mmHg     Pulse Rate 05/26/15 0459 113     Resp 05/26/15 0459 24     Temp 05/26/15 0459 98.7 F (37.1 C)     Temp Source 05/26/15 0459 Oral     SpO2 05/26/15 0459 97 %     Weight 05/26/15 0459 140 lb (63.504 kg)     Height 05/26/15 0459  (1.753 m)  Head Cir --      Peak Flow --      Pain Score 05/26/15 0500 10     Pain Loc --      Pain Edu? --      Excl. in GC? --     Constitutional: Alert and oriented. Mild to moderate distress. Appears to not be able to find a comfortable position on the gurney. Eyes: Conjunctivae are normal. PERRL. EOMI. Head: Atraumatic. Nose: No congestion/rhinnorhea. Mouth/Throat: Mucous membranes are moist.   Neck: No stridor.   Cardiovascular: Normal rate, regular rhythm. Grossly normal heart sounds.  Good peripheral circulation. Respiratory: Normal respiratory effort.  No retractions. Lungs CTAB. Gastrointestinal: SWith mild left lower quadrant tenderness No distention. No abdominal bruits.  Left CVA tenderness to palpation.  Musculoskeletal: No lower extremity tenderness nor edema.  No joint  effusions. Neurologic:  Normal speech and language. No gross focal neurologic deficits are appreciated. No gait instability. Skin:  Skin is warm, dry and intact. No rash noted. Psychiatric: Mood and affect are normal. Speech and behavior are normal.  ____________________________________________   LABS (all labs ordered are listed, but only abnormal results are displayed)  Labs Reviewed  URINALYSIS COMPLETEWITH MICROSCOPIC ([facility] ONLY) - Abnormal; Notable for the following:    Color, Urine YELLOW (*)    APPearance CLEAR (*)    Hgb urine dipstick 2+ (*)    pH 9.0 (*)    Squamous Epithelial / LPF 0-5 (*)    All other components within normal limits  CBC WITH DIFFERENTIAL/PLATELET - Abnormal; Notable for the following:    WBC 17.3 (*)    Neutro Abs 15.1 (*)    All other components within normal limits  COMPREHENSIVE METABOLIC PANEL - Abnormal; Notable for the following:    Glucose, Bld 115 (*)    ALT 12 (*)    All other components within normal limits  LIPASE, BLOOD   ____________________________________________  EKG   ____________________________________________  RADIOLOGY  4 mm stone at the left UVJ with associated mild to moderate left hydronephrosis ____________________________________________   PROCEDURES    ____________________________________________   INITIAL IMPRESSION / ASSESSMENT AND PLAN / ED COURSE  Pertinent labs & imaging results that were available during my care of the patient were reviewed by me and considered in my medical decision making (see chart for details).  ----------------------------------------- 7:01 AM on 05/26/2015 -----------------------------------------  Patient is resting comfortably now after pain medication. I retook the patient's temperature which was 98.3. The patient says that he still has his urine strainer at home from his last kidney stone. I advised him to return immediately for any worsening or concerning symptoms such  as belly or flank pain, nausea vomiting or fever. The patient understands the plan is one to comply. He also understands that he must [facility] for a follow-up appointment to be seen within 7 days. He'll be given pain medication as well as Flomax at home tell passage of the stone. The patient does have an elevated white count but I think this is a appropriate given the clinical scenario. He is no longer tachycardic or tachypneic and I do not believe that he is septic at this time from his pathology. His urine does not appear infected. ____________________________________________   FINAL CLINICAL IMPRESSION(S) / ED DIAGNOSES  Left-sided ureterolithiasis.    Myrna Blazeravid Matthew Jleigh Striplin, MD 05/26/15 [phone]

## 2015-05-26 NOTE — Discharge Instructions (Signed)
Kidney Stones °Kidney stones (urolithiasis) are deposits that form inside your kidneys. The intense pain is caused by the stone moving through the urinary tract. When the stone moves, the ureter goes into spasm around the stone. The stone is usually passed in the urine.  °CAUSES  °· A disorder that makes certain neck glands produce too much parathyroid hormone (primary hyperparathyroidism). °· A buildup of uric acid crystals, similar to gout in your joints. °· Narrowing (stricture) of the ureter. °· A kidney obstruction present at birth (congenital obstruction). °· Previous surgery on the kidney or ureters. °· Numerous kidney infections. °SYMPTOMS  °· Feeling sick to your stomach (nauseous). °· Throwing up (vomiting). °· Blood in the urine (hematuria). °· Pain that usually spreads (radiates) to the groin. °· Frequency or urgency of urination. °DIAGNOSIS  °· Taking a history and physical exam. °· Blood or urine tests. °· CT scan. °· Occasionally, an examination of the inside of the urinary bladder (cystoscopy) is performed. °TREATMENT  °· Observation. °· Increasing your fluid intake. °· Extracorporeal shock wave lithotripsy--This is a noninvasive procedure that uses shock waves to break up kidney stones. °· Surgery may be needed if you have severe pain or persistent obstruction. There are various surgical procedures. Most of the procedures are performed with the use of small instruments. Only small incisions are needed to accommodate these instruments, so recovery time is minimized. °The size, location, and chemical composition are all important variables that will determine the proper choice of action for you. Talk to your health care provider to better understand your situation so that you will minimize the risk of injury to yourself and your kidney.  °HOME CARE INSTRUCTIONS  °· Drink enough water and fluids to keep your urine clear or pale yellow. This will help you to pass the stone or stone fragments. °· Strain  all urine through the provided strainer. Keep all particulate matter and stones for your health care provider to see. The stone causing the pain may be as small as a grain of salt. It is very important to use the strainer each and every time you pass your urine. The collection of your stone will allow your health care provider to analyze it and verify that a stone has actually passed. The stone analysis will often identify what you can do to reduce the incidence of recurrences. °· Only take over-the-counter or prescription medicines for pain, discomfort, or fever as directed by your health care provider. °· Keep all follow-up visits as told by your health care provider. This is important. °· Get follow-up X-rays if required. The absence of pain does not always mean that the stone has passed. It may have only stopped moving. If the urine remains completely obstructed, it can cause loss of kidney function or even complete destruction of the kidney. It is your responsibility to make sure X-rays and follow-ups are completed. Ultrasounds of the kidney can show blockages and the status of the kidney. Ultrasounds are not associated with any radiation and can be performed easily in a matter of minutes. °· Make changes to your daily diet as told by your health care provider. You may be told to: °¨ Limit the amount of salt that you eat. °¨ Eat 5 or more servings of fruits and vegetables each day. °¨ Limit the amount of meat, poultry, fish, and eggs that you eat. °· Collect a 24-hour urine sample as told by your health care provider. You may need to collect another urine sample every 6-12   months. °SEEK MEDICAL CARE IF: °· You experience pain that is progressive and unresponsive to any pain medicine you have been prescribed. °SEEK IMMEDIATE MEDICAL CARE IF:  °· Pain cannot be controlled with the prescribed medicine. °· You have a fever or shaking chills. °· The severity or intensity of pain increases over 18 hours and is not  relieved by pain medicine. °· You develop a new onset of abdominal pain. °· You feel faint or pass out. °· You are unable to urinate. °  °This information is not intended to replace advice given to you by your health care provider. Make sure you discuss any questions you have with your health care provider. °  °Document Released: 06/30/2005 Document Revised: 03/21/2015 Document Reviewed: 12/01/2012 °Elsevier Interactive Patient Education ©2016 Elsevier Inc. ° °

## 2015-05-26 NOTE — ED Notes (Signed)
Pt presents with c/o left flank pain onset 11pm Friday, radiates to left groin.  Pt states pain is constant and throbbing.  +nausea, vomiting, and 1 episode of diarrhea.  States he is unable to void, last voided 8 pm Friday.  Hx kidney stones and he feels that is what this is.

## 2015-06-27 ENCOUNTER — Encounter: Payer: Self-pay | Admitting: Emergency Medicine

## 2015-06-27 ENCOUNTER — Emergency Department
Admission: EM | Admit: 2015-06-27 | Discharge: 2015-06-27 | Disposition: A | Discharge status: Home / Self Care | Payer: Managed Care, Other (non HMO) | Attending: Emergency Medicine | Admitting: Emergency Medicine

## 2015-06-27 DIAGNOSIS — Z79899 Other long term (current) drug therapy: Secondary | ICD-10-CM | POA: Insufficient documentation

## 2015-06-27 DIAGNOSIS — F17210 Nicotine dependence, cigarettes, uncomplicated: Secondary | ICD-10-CM | POA: Insufficient documentation

## 2015-06-27 DIAGNOSIS — J45909 Unspecified asthma, uncomplicated: Secondary | ICD-10-CM | POA: Insufficient documentation

## 2015-06-27 DIAGNOSIS — J40 Bronchitis, not specified as acute or chronic: Secondary | ICD-10-CM

## 2015-06-27 DIAGNOSIS — J069 Acute upper respiratory infection, unspecified: Secondary | ICD-10-CM | POA: Insufficient documentation

## 2015-06-27 MED ORDER — AZITHROMYCIN 250 MG PO TABS: ORAL_TABLET | ORAL | Status: DC

## 2015-06-27 MED ORDER — FLUTICASONE PROPIONATE 50 MCG/ACT NA SUSP: 1.0000 | Freq: Every day | NASAL | Status: DC

## 2015-06-27 MED ORDER — ALBUTEROL SULFATE HFA 108 (90 BASE) MCG/ACT IN AERS: 2.0000 | INHALATION_SPRAY | Freq: Four times a day (QID) | RESPIRATORY_TRACT | Status: DC | PRN

## 2015-06-27 MED ORDER — BENZONATATE 100 MG PO CAPS: 100.0000 mg | ORAL_CAPSULE | Freq: Three times a day (TID) | ORAL | Status: DC | PRN

## 2015-06-27 MED ORDER — PSEUDOEPH-BROMPHEN-DM 30-2-10 MG/5ML PO SYRP: 5.0000 mL | ORAL_SOLUTION | Freq: Four times a day (QID) | ORAL | Status: DC | PRN

## 2015-06-27 NOTE — Discharge Instructions (Signed)
Cough, Adult °A cough helps to clear your throat and lungs. A cough may last only 2-3 weeks (acute), or it may last longer than 8 weeks (chronic). Many different things can cause a cough. A cough may be a sign of an illness or another medical condition. °HOME CARE °· Pay attention to any changes in your cough. °· Take medicines only as told by your doctor. °· If you were prescribed an antibiotic medicine, take it as told by your doctor. Do not stop taking it even if you start to feel better. °· Talk with your doctor before you try using a cough medicine. °· Drink enough fluid to keep your pee (urine) clear or pale yellow. °· If the air is dry, use a cold steam vaporizer or humidifier in your home. °· Stay away from things that make you cough at work or at home. °· If your cough is worse at night, try using extra pillows to raise your head up higher while you sleep. °· Do not smoke, and try not to be around smoke. If you need help quitting, ask your doctor. °· Do not have caffeine. °· Do not drink alcohol. °· Rest as needed. °GET HELP IF: °· You have new problems (symptoms). °· You cough up yellow fluid (pus). °· Your cough does not get better after 2-3 weeks, or your cough gets worse. °· Medicine does not help your cough and you are not sleeping well. °· You have pain that gets worse or pain that is not helped with medicine. °· You have a fever. °· You are losing weight and you do not know why. °· You have night sweats. °GET HELP RIGHT AWAY IF: °· You cough up blood. °· You have trouble breathing. °· Your heartbeat is very fast. °  °This information is not intended to replace advice given to you by your health care provider. Make sure you discuss any questions you have with your health care provider. °  °Document Released: 03/13/2011 Document Revised: 03/21/2015 Document Reviewed: 09/06/2014 °Elsevier Interactive Patient Education ©2016 Elsevier Inc. ° °Upper Respiratory Infection, Adult °Most upper respiratory  infections (URIs) are a viral infection of the air passages leading to the lungs. A URI affects the nose, throat, and upper air passages. The most common type of URI is nasopharyngitis and is typically referred to as "the common cold." °URIs run their course and usually go away on their own. Most of the time, a URI does not require medical attention, but sometimes a bacterial infection in the upper airways can follow a viral infection. This is called a secondary infection. Sinus and middle ear infections are common types of secondary upper respiratory infections. °Bacterial pneumonia can also complicate a URI. A URI can worsen asthma and chronic obstructive pulmonary disease (COPD). Sometimes, these complications can require emergency medical care and may be life threatening.  °CAUSES °Almost all URIs are caused by viruses. A virus is a type of germ and can spread from one person to another.  °RISKS FACTORS °You may be at risk for a URI if:  °· You smoke.   °· You have chronic heart or lung disease. °· You have a weakened defense (immune) system.   °· You are very young or very old.   °· You have nasal allergies or asthma. °· You work in crowded or poorly ventilated areas. °· You work in health care facilities or schools. °SIGNS AND SYMPTOMS  °Symptoms typically develop 2-3 days after you come in contact with a cold virus.   Most viral URIs last 7-10 days. However, viral URIs from the influenza virus (flu virus) can last 14-18 days and are typically more severe. Symptoms may include:  °· Runny or stuffy (congested) nose.   °· Sneezing.   °· Cough.   °· Sore throat.   °· Headache.   °· Fatigue.   °· Fever.   °· Loss of appetite.   °· Pain in your forehead, behind your eyes, and over your cheekbones (sinus pain). °· Muscle aches.   °DIAGNOSIS  °Your health care provider may diagnose a URI by: °· Physical exam. °· Tests to check that your symptoms are not due to another condition such as: °¨ Strep  throat. °¨ Sinusitis. °¨ Pneumonia. °¨ Asthma. °TREATMENT  °A URI goes away on its own with time. It cannot be cured with medicines, but medicines may be prescribed or recommended to relieve symptoms. Medicines may help: °· Reduce your fever. °· Reduce your cough. °· Relieve nasal congestion. °HOME CARE INSTRUCTIONS  °· Take medicines only as directed by your health care provider.   °· Gargle warm saltwater or take cough drops to comfort your throat as directed by your health care provider. °· Use a warm mist humidifier or inhale steam from a shower to increase air moisture. This may make it easier to breathe. °· Drink enough fluid to keep your urine clear or pale yellow.   °· Eat soups and other clear broths and maintain good nutrition.   °· Rest as needed.   °· Return to work when your temperature has returned to normal or as your health care provider advises. You may need to stay home longer to avoid infecting others. You can also use a face mask and careful hand washing to prevent spread of the virus. °· Increase the usage of your inhaler if you have asthma.   °· Do not use any tobacco products, including cigarettes, chewing tobacco, or electronic cigarettes. If you need help quitting, ask your health care provider. °PREVENTION  °The best way to protect yourself from getting a cold is to practice good hygiene.  °· Avoid oral or hand contact with people with cold symptoms.   °· Wash your hands often if contact occurs.   °There is no clear evidence that vitamin C, vitamin E, echinacea, or exercise reduces the chance of developing a cold. However, it is always recommended to get plenty of rest, exercise, and practice good nutrition.  °SEEK MEDICAL CARE IF:  °· You are getting worse rather than better.   °· Your symptoms are not controlled by medicine.   °· You have chills. °· You have worsening shortness of breath. °· You have brown or red mucus. °· You have yellow or brown nasal discharge. °· You have pain in your  face, especially when you bend forward. °· You have a fever. °· You have swollen neck glands. °· You have pain while swallowing. °· You have white areas in the back of your throat. °SEEK IMMEDIATE MEDICAL CARE IF:  °· You have severe or persistent: °¨ Headache. °¨ Ear pain. °¨ Sinus pain. °¨ Chest pain. °· You have chronic lung disease and any of the following: °¨ Wheezing. °¨ Prolonged cough. °¨ Coughing up blood. °¨ A change in your usual mucus. °· You have a stiff neck. °· You have changes in your: °¨ Vision. °¨ Hearing. °¨ Thinking. °¨ Mood. °MAKE SURE YOU:  °· Understand these instructions. °· Will watch your condition. °· Will get help right away if you are not doing well or get worse. °  °This information is not intended to replace   advice given to you by your health care provider. Make sure you discuss any questions you have with your health care provider.   Document Released: 12/24/2000 Document Revised: 11/14/2014 Document Reviewed: 10/05/2013 Elsevier Interactive Patient Education 2016 [employer]   Take the prescription meds as directed.  Follow-up with [facility] for ongoing symptoms.

## 2015-06-27 NOTE — ED Provider Notes (Signed)
[facility] Emergency Department Provider Note ____________________________________________  Time seen: 1710  I have reviewed the triage vital signs and the nursing notes.  HISTORY  Chief Complaint  Generalized Body Aches  HPI Jamie Hudson is a 29 y.o. male to the ED for evaluation of symptoms that include generalized body aches, low-grade fever, and sore throat. He describes his body aches have been present for a little over a week. He did have some resolution of the body aches while dosing Aleve cold and sinus tabs. He then reports the onset of a low-grade fever this morning of 100.33F. He's had intermittent sore throat which heis related to his intermittently productive cough. He denies any chest pain, shortness of breath, or wheeze.  Past Medical History  Diagnosis Date  . Asthma   . Kidney stones     Patient Active Problem List   Diagnosis Date Noted  . Recurrent nephrolithiasis 03/09/2015    History reviewed. No pertinent past surgical history.  Current Outpatient Rx  Name  Route  Sig  Dispense  Refill  . albuterol (ACCUNEB) 0.63 MG/3ML nebulizer solution      Inhale 1 ampule by nebulization every six (6) hours as needed for wheezing.         [name]. albuterol (PROVENTIL HFA;VENTOLIN HFA) 108 (90 BASE) MCG/ACT inhaler   Inhalation   Inhale 2 puffs into the lungs every 6 (six) hours as needed for wheezing or shortness of breath.   1 Inhaler   0   . azithromycin (ZITHROMAX Z-PAK) 250 MG tablet      Take 2 tablets (500 mg) on  Day 1,  followed by 1 tablet (250 mg) once daily on Days 2 through 5.   6 each   0   . benzonatate (TESSALON PERLES) 100 MG capsule   Oral   Take 1 capsule (100 mg total) by mouth 3 (three) times daily as needed for cough (Take 1-2 per dose).   30 capsule   0   . brompheniramine-pseudoephedrine-DM 30-2-10 MG/5ML syrup   Oral   Take 5 mLs by mouth 4 (four) times daily as needed.   120 mL   0   . fluticasone  (FLONASE) 50 MCG/ACT nasal spray   Each Nare   Place 1 spray into both nostrils daily.   16 g   0   . ibuprofen (ADVIL,MOTRIN) 200 MG tablet   Oral   Take 200 mg by mouth every 6 (six) hours as needed.         . ondansetron (ZOFRAN) 4 MG tablet   Oral   Take 1 tablet (4 mg total) by mouth daily as needed for nausea or vomiting.   20 tablet   1   . oxyCODONE-acetaminophen (ROXICET) 5-325 MG tablet   Oral   Take 1-2 tablets by mouth every 6 (six) hours as needed.   15 tablet   0   . tamsulosin (FLOMAX) 0.4 MG CAPS capsule   Oral   Take 1 capsule (0.4 mg total) by mouth daily.   30 capsule   0    Allergies Review of patient's allergies indicates no known allergies.  Family History  Problem Relation Age of Onset  . Cancer Maternal Aunt   . Kidney failure Maternal Grandmother     Social History Social History  Substance Use Topics  . Smoking status: Current Every Day Smoker -- 1.00 packs/day for 15 years    Types: Cigarettes  . Smokeless tobacco: None  .  Alcohol Use: No   Review of Systems  Constitutional: Negative for fever. Eyes: Negative for visual changes. ENT: Negative for sore throat. Cardiovascular: Negative for chest pain. Respiratory: Negative for shortness of breath. Cough As above. Gastrointestinal: Negative for abdominal pain, vomiting and diarrhea. Genitourinary: Negative for dysuria. Musculoskeletal: Negative for back pain. Skin: Negative for rash. Neurological: Negative for headaches, focal weakness or numbness. ____________________________________________  PHYSICAL EXAM:  VITAL SIGNS: ED Triage Vitals  Enc Vitals Group     BP 06/27/15 1645 108/64 mmHg     Pulse Rate 06/27/15 1645 71     Resp 06/27/15 1645 16     Temp 06/27/15 1645 98.6 F (37 C)     Temp Source 06/27/15 1645 Oral     SpO2 06/27/15 1645 99 %     Weight 06/27/15 1645 140 lb (63.504 kg)     Height 06/27/15 1645  (1.753 m)     Head Cir --      Peak Flow --       Pain Score 06/27/15 1643 5     Pain Loc --      Pain Edu? --      Excl. in GC? --    Constitutional: Alert and oriented. Well appearing and in no distress. Head: Normocephalic and atraumatic.      Eyes: Conjunctivae are normal. PERRL. Normal extraocular movements      Ears: Canals clear. TMs intact bilaterally.   Nose: No congestion/rhinorrhea.   Mouth/Throat: Mucous membranes are moist.   Neck: Supple. No thyromegaly. Hematological/Lymphatic/Immunological: No cervical lymphadenopathy. Cardiovascular: Normal rate, regular rhythm.  Respiratory: Normal respiratory effort. No wheezes/rales/rhonchi. Gastrointestinal: Soft and nontender. No distention. Musculoskeletal: Nontender with normal range of motion in all extremities.  Neurologic:  Normal gait without ataxia. Normal speech and language. No gross focal neurologic deficits are appreciated. Skin:  Skin is warm, dry and intact. No rash noted. Psychiatric: Mood and affect are normal. Patient exhibits appropriate insight and judgment. ____________________________________________  INITIAL IMPRESSION / ASSESSMENT AND PLAN / ED COURSE  Child with symptoms consistent with a upper respiratory infection and potential underlying bronchitis. He will be discharged with prescriptions for albuterol, Tessalon Perles, Flonase, and Bromfed-DM. He is also provided with a Z-Pak prescription due to the prolonged course of the cough. He will follow-up with his primary care provider or one of the local community clinics for ongoing symptoms. ____________________________________________  FINAL CLINICAL IMPRESSION(S) / ED DIAGNOSES  Final diagnoses:  URI (upper respiratory infection)  Bronchitis      Lissa Hoard, PA-C 06/28/15 1627  Arnaldo Natal, MD 07/01/15 [phone]

## 2015-06-27 NOTE — ED Notes (Signed)
NAD noted at time of D/C. Pt denies questions or concerns. Pt ambulatory to the lobby at this time.  

## 2015-06-27 NOTE — ED Notes (Signed)
Pt has had generalized body aches for a little over one week.  Eased for a couple days after aleve cold and sinus.  Reports body aches came back and had fever 100.7 this AM. Has also had sore throat.

## 2016-03-05 ENCOUNTER — Emergency Department
Admission: EM | Admit: 2016-03-05 | Discharge: 2016-03-05 | Disposition: A | Discharge status: Home / Self Care | Payer: Managed Care, Other (non HMO) | Attending: Emergency Medicine | Admitting: Emergency Medicine

## 2016-03-05 ENCOUNTER — Encounter: Payer: Self-pay | Admitting: *Deleted

## 2016-03-05 DIAGNOSIS — J069 Acute upper respiratory infection, unspecified: Secondary | ICD-10-CM | POA: Insufficient documentation

## 2016-03-05 DIAGNOSIS — J45909 Unspecified asthma, uncomplicated: Secondary | ICD-10-CM | POA: Insufficient documentation

## 2016-03-05 DIAGNOSIS — Z7951 Long term (current) use of inhaled steroids: Secondary | ICD-10-CM | POA: Insufficient documentation

## 2016-03-05 DIAGNOSIS — F17210 Nicotine dependence, cigarettes, uncomplicated: Secondary | ICD-10-CM | POA: Insufficient documentation

## 2016-03-05 DIAGNOSIS — Z79899 Other long term (current) drug therapy: Secondary | ICD-10-CM | POA: Insufficient documentation

## 2016-03-05 DIAGNOSIS — J0100 Acute maxillary sinusitis, unspecified: Secondary | ICD-10-CM | POA: Insufficient documentation

## 2016-03-05 MED ORDER — AMOXICILLIN-POT CLAVULANATE 875-125 MG PO TABS: 1.0000 | ORAL_TABLET | Freq: Two times a day (BID) | ORAL | 0 refills | Status: DC

## 2016-03-05 MED ORDER — GUAIFENESIN-CODEINE 100-10 MG/5ML PO SYRP: 5.0000 mL | ORAL_SOLUTION | Freq: Three times a day (TID) | ORAL | 0 refills | Status: DC | PRN

## 2016-03-05 NOTE — ED Provider Notes (Signed)
[facility] Emergency Department Provider Note  ____________________________________________  Time seen: Approximately 8:13 AM  I have reviewed the triage vital signs and the nursing notes.   HISTORY  Chief Complaint Sinus Problem and Cough   HPI Jamie Hudson is a 30 y.o. male who presents to the emergency department for evaluation of an upper respiratory infection that has been present for the past week. He states that he has been taking Robitussin and Mucinex without relief. States that he has been coughing up green/yellow mucus. He reports a subjective fever yesterday. He states that he has also been taking ibuprofen for a headache "sinus infection"   Past Medical History:  Diagnosis Date  . Asthma   . Kidney stones     Patient Active Problem List   Diagnosis Date Noted  . Recurrent nephrolithiasis 03/09/2015    History reviewed. No pertinent surgical history.  Current Outpatient Rx  . Order #: [id number] Class: Historical Med  . Order #: [id number] Class: Print  . Order #: [id number] Class: Print  . Order #: [id number] Class: Print  . Order #: [id number] Class: Print  . Order #: [id number] Class: Print  . Order #: [id number] Class: Print  . Order #: [id number] Class: Print  . Order #: [id number] Class: Historical Med  . Order #: [id number] Class: Print  . Order #: [id number] Class: Print  . Order #: [id number] Class: Print    Allergies Review of patient's allergies indicates no known allergies.  Family History  Problem Relation Age of Onset  . Cancer Maternal Aunt   . Kidney failure Maternal Grandmother     Social History Social History  Substance Use Topics  . Smoking status: Current Every Day Smoker    Packs/day: 1.00    Years: 15.00    Types: Cigarettes  . Smokeless tobacco: Not on file  . Alcohol use No    Review of Systems Constitutional: Positive fever/chills ENT: Negative sore throat. Cardiovascular: Denies chest pain. Respiratory:  Negative for shortness of breath. Positive for cough. Gastrointestinal: Negative nausea,  positive for posttussive vomiting.  Negative for diarrhea.  Musculoskeletal: Negative for body aches Skin: Negative for rash. Neurological: Positive for headaches ____________________________________________   PHYSICAL EXAM:  VITAL SIGNS: ED Triage Vitals  Enc Vitals Group     BP 03/05/16 0803 100/70     Pulse Rate 03/05/16 0803 78     Resp 03/05/16 0803 18     Temp 03/05/16 0803 97.9 F (36.6 C)     Temp Source 03/05/16 0803 Oral     SpO2 03/05/16 0803 99 %     Weight 03/05/16 0804 140 lb (63.5 kg)     Height 03/05/16 0804 5\' 10"  (1.778 m)     Head Circumference --      Peak Flow --      Pain Score 03/05/16 0804 10     Pain Loc --      Pain Edu? --      Excl. in GC? --     Constitutional: Alert and oriented. Acutely ill appearing and in no acute distress. Eyes: Conjunctivae are normal. EOMI. Ears: Bilateral TM mildly erythematous, intact Nose: Maxillary sinus congestion; no rhinnorhea. Mouth/Throat: Mucous membranes are moist.  Oropharynx normal. Tonsils appear 1+ bilaterally. Neck: No stridor.  Lymphatic: No cervical lymphadenopathy. Cardiovascular: Normal rate, regular rhythm. Grossly normal heart sounds.  Good peripheral circulation. Respiratory: Normal respiratory effort.  No retractions. Diminished, clear throughout. Gastrointestinal: Soft and nontender.  Musculoskeletal: FROM x 4 extremities.  Neurologic:  Normal speech and language.  Skin:  Skin is warm, dry and intact. No rash noted. Psychiatric: Mood and affect are normal. Speech and behavior are normal.  ____________________________________________   LABS (all labs ordered are listed, but only abnormal results are displayed)  Labs Reviewed - No data to display ____________________________________________  EKG   ____________________________________________  RADIOLOGY  Not  indicated ____________________________________________   PROCEDURES  Procedure(s) performed: None  Critical Care performed: No  ____________________________________________   INITIAL IMPRESSION / ASSESSMENT AND PLAN / ED COURSE  Pertinent labs & imaging results that were available during my care of the patient were reviewed by me and considered in my medical decision making (see chart for details).   Patient will be prescribed Augmentin and Robitussin-AC.  Patient was advised to follow up with the primary care provider for symptoms that are not improving over the next 5 days. He was advised to return to the emergency department for symptoms that change or worsen if unable to schedule an appointment with the primary care provider or specialist. ____________________________________________   FINAL CLINICAL IMPRESSION(S) / ED DIAGNOSES  Final diagnoses:  Acute maxillary sinusitis, recurrence not specified  URI (upper respiratory infection)    Note:  This document was prepared using Dragon voice recognition software and may include unintentional dictation errors.     Chinita PesterCari B Jovonna Nickell, FNP 03/05/16 [id number]    Nita Sicklearolina Veronese, MD 03/06/16 1056

## 2016-03-05 NOTE — ED Notes (Signed)
Pt in via triage; states, "I have a sinus infection and a cough that I cannot get rid of."  Pt reports green drainage from nose, coughing up yellow mucous, "pain in throat and lungs with deep breath."  Pt states symptoms have been going on for about a week, taking otc cold/flu medication and mucinex but with no relief.  Pt A/Ox4, no immediate distress noted.

## 2016-03-05 NOTE — ED Triage Notes (Signed)
Pt states nasal congestion and cough for 1 week, states taking OTC meds without relief

## 2016-03-11 ENCOUNTER — Emergency Department: Payer: Managed Care, Other (non HMO)

## 2016-03-11 ENCOUNTER — Encounter: Payer: Self-pay | Admitting: Emergency Medicine

## 2016-03-11 ENCOUNTER — Emergency Department
Admission: EM | Admit: 2016-03-11 | Discharge: 2016-03-11 | Disposition: A | Discharge status: Home / Self Care | Payer: Managed Care, Other (non HMO) | Attending: Emergency Medicine | Admitting: Emergency Medicine

## 2016-03-11 DIAGNOSIS — F17210 Nicotine dependence, cigarettes, uncomplicated: Secondary | ICD-10-CM | POA: Insufficient documentation

## 2016-03-11 DIAGNOSIS — J40 Bronchitis, not specified as acute or chronic: Secondary | ICD-10-CM | POA: Insufficient documentation

## 2016-03-11 MED ORDER — ALBUTEROL SULFATE HFA 108 (90 BASE) MCG/ACT IN AERS: 2.0000 | INHALATION_SPRAY | Freq: Four times a day (QID) | RESPIRATORY_TRACT | 2 refills | Status: AC | PRN

## 2016-03-11 MED ORDER — ALBUTEROL SULFATE (2.5 MG/3ML) 0.083% IN NEBU
2.5000 mg | INHALATION_SOLUTION | Freq: Once | RESPIRATORY_TRACT | Status: AC
  Administered 2016-03-11: 2.5 mg via RESPIRATORY_TRACT
  Filled 2016-03-11: qty 3

## 2016-03-11 MED ORDER — PREDNISONE 10 MG PO TABS: 50.0000 mg | ORAL_TABLET | Freq: Every day | ORAL | 0 refills | Status: DC

## 2016-03-11 MED ORDER — PREDNISONE 20 MG PO TABS
50.0000 mg | ORAL_TABLET | Freq: Once | ORAL | Status: AC
  Administered 2016-03-11: 50 mg via ORAL
  Filled 2016-03-11: qty 1

## 2016-03-11 NOTE — ED Notes (Signed)
Pt c/o increased nasal congestion and cough. Was seen last week and given amox rx with no relief. Pt denies CP.

## 2016-03-11 NOTE — ED Triage Notes (Signed)
Patient ambulatory to triage with steady gait, without difficulty or distress noted; pt reports persistent sinus congestion & yellow sputum x 2weeks

## 2016-03-12 NOTE — ED Provider Notes (Signed)
[facility] Emergency Department Provider Note ____________________________________________  Time seen: Approximately 7:14 PM  I have reviewed the triage vital signs and the nursing notes.   HISTORY  Chief Complaint Cough    HPI Jamie Hudson is a 30 y.o. male who presents to the emergency department for evaluation of cough and cold symptoms that have not been relieved with antibiotics and cough medicine. He was evaluated here on 03/05/16 and started on augmentin and robitussin ac. He continues to have some cough and has developed some wheezing as well. Symptoms are worse at night and in the morning. He is a smoker. He denies fever.   Past Medical History:  Diagnosis Date  . Asthma   . Kidney stones     Patient Active Problem List   Diagnosis Date Noted  . Recurrent nephrolithiasis 03/09/2015    History reviewed. No pertinent surgical history.  Prior to Admission medications   Medication Sig Start Date End Date Taking? Authorizing Provider  albuterol (PROVENTIL HFA;VENTOLIN HFA) 108 (90 Base) MCG/ACT inhaler Inhale 2 puffs into the lungs every 6 (six) hours as needed for wheezing or shortness of breath. 03/11/16   Mendell Bontempo B Manmeet Arzola, FNP  amoxicillin-clavulanate (AUGMENTIN) 875-125 MG tablet Take 1 tablet by mouth 2 (two) times daily. 03/05/16   Tyreque Finken B Odaly Peri, FNP  guaiFENesin-codeine (ROBITUSSIN AC) 100-10 MG/5ML syrup Take 5 mLs by mouth 3 (three) times daily as needed for cough. 03/05/16   Chinita Pester, FNP  ibuprofen (ADVIL,MOTRIN) 200 MG tablet Take 200 mg by mouth every 6 (six) hours as needed.    Historical Provider, MD  oxyCODONE-acetaminophen (ROXICET) 5-325 MG tablet Take 1-2 tablets by mouth every 6 (six) hours as needed. 05/26/15   Myrna Blazer, MD  predniSONE (DELTASONE) 10 MG tablet Take 5 tablets (50 mg total) by mouth daily. 03/11/16   Chinita Pester, FNP  tamsulosin (FLOMAX) 0.4 MG CAPS capsule Take 1 capsule (0.4 mg total) by  mouth daily. 05/26/15   Myrna Blazer, MD    Allergies Review of patient's allergies indicates no known allergies.  Family History  Problem Relation Age of Onset  . Cancer Maternal Aunt   . Kidney failure Maternal Grandmother     Social History Social History  Substance Use Topics  . Smoking status: Current Every Day Smoker    Packs/day: 1.00    Years: 15.00    Types: Cigarettes  . Smokeless tobacco: Never Used  . Alcohol use No    Review of Systems Constitutional: No recent illness. Cardiovascular: Denies chest pain or palpitations. Respiratory: Denies shortness of breath. Positive for cough and wheezing. Musculoskeletal: Denies pain. Skin: Negative for rash, wound, lesion. Neurological: Negative for focal weakness or numbness.  ____________________________________________   PHYSICAL EXAM:  VITAL SIGNS: ED Triage Vitals  Enc Vitals Group     BP 03/11/16 2230 (!) 100/57     Pulse Rate 03/11/16 2230 (!) 56     Resp 03/11/16 2230 20     Temp 03/11/16 2230 97.9 F (36.6 C)     Temp Source 03/11/16 2230 Oral     SpO2 03/11/16 2230 99 %     Weight 03/11/16 2231 140 lb (63.5 kg)     Height 03/11/16 2231 5\' 9"  (1.753 m)     Head Circumference --      Peak Flow --      Pain Score 03/11/16 2254 0     Pain Loc --      Pain  Edu? --      Excl. in GC? --     Constitutional: Alert and oriented. Well appearing and in no acute distress. Eyes: Conjunctivae are normal. EOMI. Head: Atraumatic. Neck: No stridor.  Respiratory: Normal respiratory effort. Faint expiratory wheezes noted in the right lower lobe. Musculoskeletal: FROM throughout. Neurologic:  Normal speech and language. No gross focal neurologic deficits are appreciated. Speech is normal. No gait instability. Skin:  Skin is warm, dry and intact. Atraumatic. Psychiatric: Mood and affect are normal. Speech and behavior are normal.  ____________________________________________   LABS (all labs  ordered are listed, but only abnormal results are displayed)  Labs Reviewed - No data to display ____________________________________________  RADIOLOGY  Chest x-ray negative for acute abnormality per radiology. ____________________________________________   PROCEDURES  Procedure(s) performed: none   ____________________________________________   INITIAL IMPRESSION / ASSESSMENT AND PLAN / ED COURSE  Clinical Course    Pertinent labs & imaging results that were available during my care of the patient were reviewed by me and considered in my medical decision making (see chart for details).  Albuterol nebulizer treatment given in the emergency department with relief of the wheezes that were initially noted in the right lower lung base.  Prescriptions for albuterol and prednisone and will be prescribed tonight. Patient was encouraged to take his Augmentin until finished. He was encouraged to follow up with the primary care provider for his choice for symptoms that are not improving over the next few days. He was instructed to return to the emergency department for symptoms that change or worsen if he is unable schedule an appointment. ____________________________________________   FINAL CLINICAL IMPRESSION(S) / ED DIAGNOSES  Final diagnoses:  Bronchitis       Chinita PesterCari B Lititia Sen, FNP 03/12/16 1920    Arnaldo NatalPaul F Malinda, MD 03/12/16 2249

## 2017-04-20 ENCOUNTER — Emergency Department
Admission: EM | Admit: 2017-04-20 | Discharge: 2017-04-20 | Disposition: A | Discharge status: Home / Self Care | Payer: Managed Care, Other (non HMO) | Attending: Emergency Medicine | Admitting: Emergency Medicine

## 2017-04-20 ENCOUNTER — Encounter: Payer: Self-pay | Admitting: *Deleted

## 2017-04-20 DIAGNOSIS — J45909 Unspecified asthma, uncomplicated: Secondary | ICD-10-CM | POA: Insufficient documentation

## 2017-04-20 DIAGNOSIS — L02414 Cutaneous abscess of left upper limb: Secondary | ICD-10-CM | POA: Insufficient documentation

## 2017-04-20 DIAGNOSIS — F17210 Nicotine dependence, cigarettes, uncomplicated: Secondary | ICD-10-CM | POA: Insufficient documentation

## 2017-04-20 DIAGNOSIS — Z79899 Other long term (current) drug therapy: Secondary | ICD-10-CM | POA: Insufficient documentation

## 2017-04-20 MED ORDER — SULFAMETHOXAZOLE-TRIMETHOPRIM 800-160 MG PO TABS: 1.0000 | ORAL_TABLET | Freq: Two times a day (BID) | ORAL | 0 refills | Status: DC

## 2017-04-20 NOTE — ED Triage Notes (Signed)
Pt has  2 abscesses to left forearm.   Area red and tender to touch.  Possible spider bite states pt.  Pt alert.

## 2017-04-20 NOTE — ED Notes (Signed)
Pt presents with 2 abscesses to left forearm. States he was told it was possible he was bitten by a brown recluse. He reports that he has aching, tight feeling in his arm, and is unable to sleep due to the pain. These abscesses appeared on Friday. NAD noted.

## 2017-04-20 NOTE — Discharge Instructions (Signed)
Please continue with warm moist compresses to the left arm. Take antibiotics as prescribed. Return to the emergency department for any fevers, increasing pain, swelling, drainage, worsening symptoms or changes in her health.

## 2017-04-20 NOTE — ED Notes (Signed)
Pt discharged to home.  Family member driving.  Discharge instructions reviewed.  Verbalized understanding.  No questions or concerns at this time.  Teach back verified.  Pt in NAD.  No items left in ED.   

## 2017-04-20 NOTE — ED Provider Notes (Signed)
[facility]-EMERGENCY DEPARTMENT Provider Note   CSN: [id number] Arrival date & time: 04/20/17  1545     History   Chief Complaint Chief Complaint  Patient presents with  . Abscess    HPI Jamie Hudson is a 31 y.o. male presents to the emergency department for evaluation of abscess to left arm. Abscess began 3 days ago. Patient states he had 2 bumps develop on the dorsal aspect of his left forearm along the midportion of the forearm in the proximal third. They have increased in size as well as pain. He tried popping these 24 hours ago and had some mild clear serous drainage. He denies any fevers, body aches or any other symptoms throughout his body. He denies any tick bites. 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 Pain is currently mild. He has not been on any antibiotics. Denies any other rashes on his body.  HPI  Past Medical History:  Diagnosis Date  . Asthma   . Kidney stones     Patient Active Problem List   Diagnosis Date Noted  . Recurrent nephrolithiasis 03/09/2015    No past surgical history on file.     Home Medications    Prior to Admission medications   Medication Sig Start Date End Date Taking? Authorizing Provider  albuterol (PROVENTIL HFA;VENTOLIN HFA) 108 (90 Base) MCG/ACT inhaler Inhale 2 puffs into the lungs every 6 (six) hours as needed for wheezing or shortness of breath. 03/11/16   Triplett, Cari B, FNP  amoxicillin-clavulanate (AUGMENTIN) 875-125 MG tablet Take 1 tablet by mouth 2 (two) times daily. 03/05/16   Triplett, Cari B, FNP  guaiFENesin-codeine (ROBITUSSIN AC) 100-10 MG/5ML syrup Take 5 mLs by mouth 3 (three) times daily as needed for cough. 03/05/16   Triplett, Rulon Eisenmenger B, FNP  ibuprofen (ADVIL,MOTRIN) 200 MG tablet Take 200 mg by mouth every 6 (six) hours as needed.    [provider]  oxyCODONE-acetaminophen (ROXICET) 5-325 MG tablet Take 1-2 tablets by mouth  every 6 (six) hours as needed. 05/26/15   Schaevitz, Myra Rude, MD  predniSONE (DELTASONE) 10 MG tablet Take 5 tablets (50 mg total) by mouth daily. 03/11/16   Triplett, Rulon Eisenmenger B, FNP  sulfamethoxazole-trimethoprim (BACTRIM DS,SEPTRA DS) 800-160 MG tablet Take 1 tablet by mouth 2 (two) times daily. 04/20/17   Evon Slack, PA-C  tamsulosin (FLOMAX) 0.4 MG CAPS capsule Take 1 capsule (0.4 mg total) by mouth daily. 05/26/15   Schaevitz, Myra Rude, MD    Family History Family History  Problem Relation Age of Onset  . Cancer Maternal Aunt   . Kidney failure Maternal Grandmother     Social History Social History  Substance Use Topics  . Smoking status: Current Every Day Smoker    Packs/day: 1.00    Years: 15.00    Types: Cigarettes  . Smokeless tobacco: Never Used  . Alcohol use No     Allergies   Patient has no known allergies.   Review of Systems Review of Systems  Constitutional: Negative for fever.  Respiratory: Negative for shortness of breath.   Cardiovascular: Negative for chest pain.  Gastrointestinal: Negative for diarrhea and vomiting.  Skin: Positive for wound. Negative for color change and rash.  Neurological: Negative for headaches.     Physical Exam Updated Vital Signs BP (!) 138/99 (BP Location: Left Arm)   Pulse 85   Temp 99.1 F (37.3  C) (Oral)   Resp 16   Ht  (1.753 m)   Wt 68 kg (150 lb)   SpO2 98%   BMI 22.15 kg/m   Physical Exam  Constitutional: He is oriented to person, place, and time. He appears well-developed and well-nourished.  HENT:  Head: Normocephalic and atraumatic.  Eyes: Conjunctivae are normal.  Neck: Normal range of motion.  Cardiovascular: Normal rate.   Pulmonary/Chest: Effort normal. No respiratory distress.  Musculoskeletal: Normal range of motion.  Neurological: He is alert and oriented to person, place, and time.  Skin: Skin is warm. No rash noted.  Examination of the dorsal aspect of the left forearm  shows along the proximal third there is a 2 cm diameter area of redness with induration and no fluctuance. There is a centralized black lesion in the middle that does not appear to be necrotic. There is a similar appearing induration along the distal midportion of the dorsal aspect of the forearm that is only 1 cm in diameter with a similar centralized black lesion in the middle that is not necrotic. Both areas are indurated with no fluctuance. There is no surrounding warmth or erythema. No compartmental swelling.  Psychiatric: He has a normal mood and affect. His behavior is normal. Thought content normal.     ED Treatments / Results  Labs (all labs ordered are listed, but only abnormal results are displayed) Labs Reviewed - No data to display  EKG  EKG Interpretation None       Radiology No results found.  Procedures Procedures (including critical care time)  Medications Ordered in ED Medications - No data to display   Initial Impression / Assessment and Plan / ED Course  I have reviewed the triage vital signs and the nursing notes.  Pertinent labs & imaging results that were available during my care of the patient were reviewed by me and considered in my medical decision making (see chart for details).    31 year old with 2 small nonfluctuant abscess is to the dorsal aspect of the left forearm. One 1 cm diameter the second 2 cm in diameter. Patient will start Bactrim DS. He is educated on worsening signs to return to the emergency department for.  Final Clinical Impressions(s) / ED Diagnoses   Final diagnoses:  Abscess of left arm    New Prescriptions New Prescriptions   SULFAMETHOXAZOLE-TRIMETHOPRIM (BACTRIM DS,SEPTRA DS) 800-160 MG TABLET    Take 1 tablet by mouth 2 (two) times daily.     Evon Slack, PA-C 04/20/17 1726    Phineas Semen, MD 04/20/17 [phone]

## 2018-11-28 ENCOUNTER — Emergency Department
Admission: EM | Admit: 2018-11-28 | Discharge: 2018-11-28 | Disposition: A | Discharge status: Home / Self Care | Payer: Managed Care, Other (non HMO) | Attending: Emergency Medicine | Admitting: Emergency Medicine

## 2018-11-28 ENCOUNTER — Other Ambulatory Visit: Payer: Self-pay

## 2018-11-28 ENCOUNTER — Encounter: Payer: Self-pay | Admitting: Emergency Medicine

## 2018-11-28 DIAGNOSIS — R1010 Upper abdominal pain, unspecified: Secondary | ICD-10-CM

## 2018-11-28 DIAGNOSIS — K2900 Acute gastritis without bleeding: Secondary | ICD-10-CM | POA: Insufficient documentation

## 2018-11-28 DIAGNOSIS — Z79899 Other long term (current) drug therapy: Secondary | ICD-10-CM | POA: Insufficient documentation

## 2018-11-28 DIAGNOSIS — J45909 Unspecified asthma, uncomplicated: Secondary | ICD-10-CM | POA: Insufficient documentation

## 2018-11-28 DIAGNOSIS — F17210 Nicotine dependence, cigarettes, uncomplicated: Secondary | ICD-10-CM | POA: Insufficient documentation

## 2018-11-28 LAB — CBC
HCT: 43.4 % (ref 39.0–52.0)
Hemoglobin: 15 g/dL (ref 13.0–17.0)
MCH: 31.6 pg (ref 26.0–34.0)
MCHC: 34.6 g/dL (ref 30.0–36.0)
MCV: 91.6 fL (ref 80.0–100.0)
Platelets: 221 10*3/uL (ref 150–400)
RBC: 4.74 MIL/uL (ref 4.22–5.81)
RDW: 12.7 % (ref 11.5–15.5)
WBC: 7.3 10*3/uL (ref 4.0–10.5)
nRBC: 0 % (ref 0.0–0.2)

## 2018-11-28 LAB — URINALYSIS, COMPLETE (UACMP) WITH MICROSCOPIC
Bacteria, UA: NONE SEEN
Bilirubin Urine: NEGATIVE
Glucose, UA: NEGATIVE mg/dL
Hgb urine dipstick: NEGATIVE
Ketones, ur: NEGATIVE mg/dL
Leukocytes,Ua: NEGATIVE
Nitrite: NEGATIVE
Protein, ur: NEGATIVE mg/dL
Specific Gravity, Urine: 1.024 (ref 1.005–1.030)
Squamous Epithelial / LPF: NONE SEEN (ref 0–5)
pH: 5 (ref 5.0–8.0)

## 2018-11-28 LAB — COMPREHENSIVE METABOLIC PANEL
ALT: 14 U/L (ref 0–44)
AST: 17 U/L (ref 15–41)
Albumin: 4 g/dL (ref 3.5–5.0)
Alkaline Phosphatase: 60 U/L (ref 38–126)
Anion gap: 7 (ref 5–15)
BUN: 20 mg/dL (ref 6–20)
CO2: 25 mmol/L (ref 22–32)
Calcium: 8.6 mg/dL — ABNORMAL LOW (ref 8.9–10.3)
Chloride: 108 mmol/L (ref 98–111)
Creatinine, Ser: 0.76 mg/dL (ref 0.61–1.24)
GFR calc Af Amer: >60 mL/min (ref >60)
GFR calc non Af Amer: >60 mL/min (ref >60)
Glucose, Bld: 94 mg/dL (ref 70–99)
Potassium: 4 mmol/L (ref 3.5–5.1)
Sodium: 140 mmol/L (ref 135–145)
Total Bilirubin: 0.5 mg/dL (ref 0.3–1.2)
Total Protein: 6.6 g/dL (ref 6.5–8.1)

## 2018-11-28 LAB — LIPASE, BLOOD: Lipase: 47 U/L (ref 11–51)

## 2018-11-28 MED ORDER — SODIUM CHLORIDE 0.9% FLUSH: 3.0000 mL | Freq: Once | INTRAVENOUS | Status: DC

## 2018-11-28 MED ORDER — SUCRALFATE 1 G PO TABS: 1.0000 g | ORAL_TABLET | Freq: Four times a day (QID) | ORAL | 1 refills | Status: DC

## 2018-11-28 MED ORDER — FAMOTIDINE 20 MG PO TABS: 20.0000 mg | ORAL_TABLET | Freq: Two times a day (BID) | ORAL | 0 refills | Status: DC

## 2018-11-28 NOTE — ED Provider Notes (Addendum)
[facility] Emergency Department Provider Note  ____________________________________________  Time seen: Approximately 6:18 PM  I have reviewed the triage vital signs and the nursing notes.   HISTORY  Chief Complaint Nausea; Diarrhea; and Abdominal Pain    HPI Jamie Hudson is a 33 y.o. male with a past medical history of kidney stones who comes the ED complaining of diffuse upper abdominal pain, nonradiating, worse in the morning when he wakes up, no alleviating factors.  Not affected by food.  Onset after [place] 2 days ago, waxing and waning since then.  No sick contacts.  No fevers chills sweats chest pain or shortness of breath.  Pain is mild to moderate intensity.   He also notes having some loose bowel movements over the past 2 days, greenish-yellow, not black or bloody.   Past Medical History:  Diagnosis Date  . Asthma   . Kidney stones      Patient Active Problem List   Diagnosis Date Noted  . Recurrent nephrolithiasis 03/09/2015     History reviewed. No pertinent surgical history.   Prior to Admission medications   Medication Sig Start Date End Date Taking? Authorizing Provider  albuterol (PROVENTIL HFA;VENTOLIN HFA) 108 (90 Base) MCG/ACT inhaler Inhale 2 puffs into the lungs every 6 (six) hours as needed for wheezing or shortness of breath. 03/11/16   Triplett, Cari B, FNP  amoxicillin-clavulanate (AUGMENTIN) 875-125 MG tablet Take 1 tablet by mouth 2 (two) times daily. 03/05/16   Triplett, Rulon Eisenmengerari B, FNP  famotidine (PEPCID) 20 MG tablet Take 1 tablet (20 mg total) by mouth 2 (two) times daily. 11/28/18   Sharman CheekStafford, Thalya Fouche, MD  guaiFENesin-codeine [facility](ROBITUSSIN AC) 100-10 MG/5ML syrup Take 5 mLs by mouth 3 (three) times daily as needed for cough. 03/05/16   Triplett, Rulon Eisenmengerari B, FNP  ibuprofen (ADVIL,MOTRIN) 200 MG tablet Take 200 mg by mouth every 6 (six) hours as needed.    [provider]  oxyCODONE-acetaminophen (ROXICET)  5-325 MG tablet Take 1-2 tablets by mouth every 6 (six) hours as needed. 05/26/15   Schaevitz, Myra Rudeavid Matthew, MD  predniSONE (DELTASONE) 10 MG tablet Take 5 tablets (50 mg total) by mouth daily. 03/11/16   Triplett, Rulon Eisenmengerari B, FNP  sucralfate (CARAFATE) 1 g tablet Take 1 tablet (1 g total) by mouth 4 (four) times daily. 11/28/18   Sharman CheekStafford, Yosiah Jasmin, MD  sulfamethoxazole-trimethoprim (BACTRIM DS,SEPTRA DS) 800-160 MG tablet Take 1 tablet by mouth 2 (two) times daily. 04/20/17   Evon SlackGaines, Thomas C, PA-C  tamsulosin (FLOMAX) 0.4 MG CAPS capsule Take 1 capsule (0.4 mg total) by mouth daily. 05/26/15   Schaevitz, Myra Rudeavid Matthew, MD     Allergies Patient has no known allergies.   Family History  Problem Relation Age of Onset  . Cancer Maternal Aunt   . Kidney failure Maternal Grandmother     Social History Social History   Tobacco Use  . Smoking status: Current Every Day Smoker    Packs/day: 1.00    Years: 15.00    Pack years: 15.00    Types: Cigarettes  . Smokeless tobacco: Never Used  Substance Use Topics  . Alcohol use: No  . Drug use: No    Review of Systems  Constitutional:   No fever or chills.  ENT:   No sore throat. No rhinorrhea. Cardiovascular:   No chest pain or syncope. Respiratory:   No dyspnea or cough. Gastrointestinal: Positive as above for abdominal pain.  No vomiting or diarrhea. Musculoskeletal:   Negative  for focal pain or swelling All other systems reviewed and are negative except as documented above in ROS and HPI.  ____________________________________________   PHYSICAL EXAM:  VITAL SIGNS: ED Triage Vitals  Enc Vitals Group     BP 11/28/18 1300 99/73     Pulse Rate 11/28/18 1300 95     Resp --      Temp 11/28/18 1300 98.1 F (36.7 C)     Temp Source 11/28/18 1300 Oral     SpO2 11/28/18 1300 98 %     Weight 11/28/18 1301 150 lb (68 kg)     Height 11/28/18 1301 5\' 10"  (1.778 m)     Head Circumference --      Peak Flow --      Pain Score 11/28/18  1304 5     Pain Loc --      Pain Edu? --      Excl. in [place]? --     Vital signs reviewed, nursing assessments reviewed.   Constitutional:   Alert and oriented. Non-toxic appearance. Eyes:   Conjunctivae are normal. EOMI. PERRL. ENT      Head:   Normocephalic and atraumatic.      Nose:   No congestion/rhinnorhea.       Mouth/Throat:   MMM, no pharyngeal erythema. No peritonsillar mass.       Neck:   No meningismus. Full ROM. Hematological/Lymphatic/Immunilogical:   No cervical lymphadenopathy. Cardiovascular:   RRR. Symmetric bilateral radial and DP pulses.  No murmurs. Cap refill less than 2 seconds. Respiratory:   Normal respiratory effort without tachypnea/retractions. Breath sounds are clear and equal bilaterally. No wheezes/rales/rhonchi. Gastrointestinal:   Soft with mild left upper quadrant tenderness . Non distended. There is no CVA tenderness.  No rebound, rigidity, or guarding. Musculoskeletal:   Normal range of motion in all extremities. No joint effusions.  No lower extremity tenderness.  No edema. Neurologic:   Normal speech and language.  Motor grossly intact. No acute focal neurologic deficits are appreciated.  Skin:    Skin is warm, dry and intact. No rash noted.  No petechiae, purpura, or bullae.  ____________________________________________    LABS (pertinent positives/negatives) (all labs ordered are listed, but only abnormal results are displayed) Labs Reviewed  COMPREHENSIVE METABOLIC PANEL - Abnormal; Notable for the following components:      Result Value   Calcium 8.6 (*)    All other components within normal limits  URINALYSIS, COMPLETE (UACMP) WITH MICROSCOPIC - Abnormal; Notable for the following components:   Color, Urine YELLOW (*)    APPearance CLEAR (*)    All other components within normal limits  LIPASE, BLOOD  CBC   ____________________________________________   EKG    ____________________________________________    RADIOLOGY  No  results found.  ____________________________________________   PROCEDURES Procedures  ____________________________________________    CLINICAL IMPRESSION / ASSESSMENT AND PLAN / ED COURSE  Medications ordered in the ED: Medications  sodium chloride flush (NS) 0.9 % injection 3 mL (has no administration in time range)    Pertinent labs & imaging results that were available during my care of the patient were reviewed by me and considered in my medical decision making (see chart for details).  VERBLE STODDART was evaluated in Emergency Department on 11/28/2018 for the symptoms described in the history of present illness. He was evaluated in the context of the global COVID-19 pandemic, which necessitated consideration that the patient might be at risk for infection with the SARS-CoV-2 virus that  causes COVID-19. Institutional protocols and algorithms that pertain to the evaluation of patients at risk for COVID-19 are in a state of rapid change based on information released by regulatory bodies including the CDC and federal and state organizations. These policies and algorithms were followed during the patient's care in the ED.   Patient is nontoxic, vital signs unremarkable, exam overall is reassuring.  Symptoms are consistent with gastritis, corroborated by exam as well as history of recent [place] food that seem to incite the whole event. Considering the patient's symptoms, medical history, and physical examination today, I have low suspicion for cholecystitis or biliary pathology, pancreatitis, perforation or bowel obstruction, hernia, intra-abdominal abscess, AAA or dissection, volvulus or intussusception, mesenteric ischemia, or appendicitis.        ____________________________________________   FINAL CLINICAL IMPRESSION(S) / ED DIAGNOSES    Final diagnoses:  Upper abdominal pain  Acute gastritis without hemorrhage, unspecified gastritis type     ED Discharge Orders          Ordered    sucralfate (CARAFATE) 1 g tablet  4 times daily     11/28/18 1818    famotidine (PEPCID) 20 MG tablet  2 times daily     11/28/18 1818          Portions of this note were generated with dragon dictation software. Dictation errors may occur despite best attempts at proofreading.   Sharman Cheek, MD 11/28/18 1821    Sharman Cheek, MD 11/28/18 [phone]

## 2018-11-28 NOTE — ED Triage Notes (Signed)
Pt to ED via POV c/o abdominal pain, heart burn, Nausea, and diarrhea since Friday. Pt is in NAD.

## 2018-11-28 NOTE — ED Notes (Signed)
MD Stafford at bedside. 

## 2018-11-28 NOTE — ED Notes (Signed)
Pt verbalized understanding of discharge instructions. NAD at this time. 

## 2019-04-13 ENCOUNTER — Encounter: Payer: Self-pay | Admitting: Emergency Medicine

## 2019-04-13 ENCOUNTER — Other Ambulatory Visit: Payer: Self-pay

## 2019-04-13 ENCOUNTER — Emergency Department
Admission: EM | Admit: 2019-04-13 | Discharge: 2019-04-13 | Disposition: A | Discharge status: Home / Self Care | Payer: Self-pay | Attending: Emergency Medicine | Admitting: Emergency Medicine

## 2019-04-13 DIAGNOSIS — J45909 Unspecified asthma, uncomplicated: Secondary | ICD-10-CM | POA: Insufficient documentation

## 2019-04-13 DIAGNOSIS — F17210 Nicotine dependence, cigarettes, uncomplicated: Secondary | ICD-10-CM | POA: Insufficient documentation

## 2019-04-13 DIAGNOSIS — F1010 Alcohol abuse, uncomplicated: Secondary | ICD-10-CM

## 2019-04-13 DIAGNOSIS — F10188 Alcohol abuse with other alcohol-induced disorder: Secondary | ICD-10-CM | POA: Insufficient documentation

## 2019-04-13 DIAGNOSIS — K2920 Alcoholic gastritis without bleeding: Secondary | ICD-10-CM | POA: Insufficient documentation

## 2019-04-13 LAB — COMPREHENSIVE METABOLIC PANEL
ALT: 14 U/L (ref 0–44)
AST: 17 U/L (ref 15–41)
Albumin: 4.5 g/dL (ref 3.5–5.0)
Alkaline Phosphatase: 52 U/L (ref 38–126)
Anion gap: 9 (ref 5–15)
BUN: 14 mg/dL (ref 6–20)
CO2: 24 mmol/L (ref 22–32)
Calcium: 9.1 mg/dL (ref 8.9–10.3)
Chloride: 103 mmol/L (ref 98–111)
Creatinine, Ser: 0.55 mg/dL — ABNORMAL LOW (ref 0.61–1.24)
GFR calc Af Amer: >60 mL/min (ref >60)
GFR calc non Af Amer: >60 mL/min (ref >60)
Glucose, Bld: 94 mg/dL (ref 70–99)
Potassium: 3.9 mmol/L (ref 3.5–5.1)
Sodium: 136 mmol/L (ref 135–145)
Total Bilirubin: 0.8 mg/dL (ref 0.3–1.2)
Total Protein: 7.1 g/dL (ref 6.5–8.1)

## 2019-04-13 LAB — URINALYSIS, COMPLETE (UACMP) WITH MICROSCOPIC
Bacteria, UA: NONE SEEN
Bilirubin Urine: NEGATIVE
Glucose, UA: NEGATIVE mg/dL
Hgb urine dipstick: NEGATIVE
Ketones, ur: NEGATIVE mg/dL
Leukocytes,Ua: NEGATIVE
Nitrite: NEGATIVE
Protein, ur: NEGATIVE mg/dL
Specific Gravity, Urine: 1.017 (ref 1.005–1.030)
Squamous Epithelial / LPF: NONE SEEN (ref 0–5)
WBC, UA: NONE SEEN WBC/hpf (ref 0–5)
pH: 6 (ref 5.0–8.0)

## 2019-04-13 LAB — CBC
HCT: 43.7 % (ref 39.0–52.0)
Hemoglobin: 15.3 g/dL (ref 13.0–17.0)
MCH: 32.1 pg (ref 26.0–34.0)
MCHC: 35 g/dL (ref 30.0–36.0)
MCV: 91.6 fL (ref 80.0–100.0)
Platelets: 246 10*3/uL (ref 150–400)
RBC: 4.77 MIL/uL (ref 4.22–5.81)
RDW: 12.9 % (ref 11.5–15.5)
WBC: 7.3 10*3/uL (ref 4.0–10.5)
nRBC: 0 % (ref 0.0–0.2)

## 2019-04-13 LAB — LIPASE, BLOOD: Lipase: 24 U/L (ref 11–51)

## 2019-04-13 MED ORDER — OMEPRAZOLE 20 MG PO CPDR: 20.0000 mg | DELAYED_RELEASE_CAPSULE | Freq: Every day | ORAL | 1 refills | Status: AC

## 2019-04-13 MED ORDER — LIDOCAINE VISCOUS HCL 2 % MT SOLN
15.0000 mL | Freq: Once | OROMUCOSAL | Status: AC
  Administered 2019-04-13: 16:00:00 15 mL via ORAL
  Filled 2019-04-13: qty 15

## 2019-04-13 MED ORDER — ALUM & MAG HYDROXIDE-SIMETH 200-200-20 MG/5ML PO SUSP
15.0000 mL | Freq: Once | ORAL | Status: AC
  Administered 2019-04-13: 15 mL via ORAL
  Filled 2019-04-13: qty 30

## 2019-04-13 NOTE — ED Notes (Signed)
This RN introduced self to pt. Pt states he has had a burning pain bilaterally in the lower abdomen for a few weeks. Pt denies N/V/D. Pt states he has had two medications prescribed that have stopped working.

## 2019-04-13 NOTE — ED Triage Notes (Signed)
Patient reports "burning" in lower abdomen x1 month. States he was seen here previously for same and prescribed 2 medications that worked for a while but have stopped helping. Denies N/V/D. Reports urinary frequency.

## 2019-04-13 NOTE — ED Provider Notes (Signed)
[facility] Emergency Department Provider Note   ____________________________________________   First MD Initiated Contact with Patient 04/13/19 1458     (approximate)  I have reviewed the triage vital signs and the nursing notes.   HISTORY  Chief Complaint Abdominal Pain    HPI Jamie Hudson is a 33 y.o. male with past medical history of asthma presents to the ED complaining of abdominal pain.  Patient reports he has been dealing with greater than 1 month of burning pain in his epigastrium as well as his bilateral lower quadrants.  He states that is present most days and not exacerbated or alleviated by anything in particular.  It is not associated with any vomiting or diarrhea and he denies any association with eating.  He has not had any fevers, denies any flank pain, dysuria, or hematuria.  He was evaluated for similar symptoms in the ED about 1 month ago, was prescribed Carafate and Pepcid.  Patient states that these provided him with some relief, but has now stopped working.  He has a referral to be seen by GI in 2 weeks.  He does admit to drinking alcohol on a daily basis, typically about 1/2 L of vodka.  He stopped drinking 2 days ago, denies any withdrawal symptoms currently or in the past.        Past Medical History:  Diagnosis Date  . Asthma   . Kidney stones     Patient Active Problem List   Diagnosis Date Noted  . Recurrent nephrolithiasis 03/09/2015    History reviewed. No pertinent surgical history.  Prior to Admission medications   Medication Sig Start Date End Date Taking? Authorizing Provider  albuterol (PROVENTIL HFA;VENTOLIN HFA) 108 (90 Base) MCG/ACT inhaler Inhale 2 puffs into the lungs every 6 (six) hours as needed for wheezing or shortness of breath. 03/11/16   Triplett, Cari B, FNP  famotidine (PEPCID) 20 MG tablet Take 1 tablet (20 mg total) by mouth 2 (two) times daily. Patient not taking: Reported on 04/13/2019 11/28/18    Sharman Cheek, MD  ibuprofen (ADVIL,MOTRIN) 200 MG tablet Take 200 mg by mouth every 6 (six) hours as needed.    [provider]  omeprazole (PRILOSEC) 20 MG capsule Take 1 capsule (20 mg total) by mouth daily. 04/13/19 06/12/19  Chesley Noon, MD  sucralfate (CARAFATE) 1 g tablet Take 1 tablet (1 g total) by mouth 4 (four) times daily. Patient not taking: Reported on 04/13/2019 11/28/18   Sharman Cheek, MD    Allergies Patient has no known allergies.  Family History  Problem Relation Age of Onset  . Cancer Maternal Aunt   . Kidney failure Maternal Grandmother     Social History Social History   Tobacco Use  . Smoking status: Current Every Day Smoker    Packs/day: 1.00    Years: 15.00    Pack years: 15.00    Types: Cigarettes  . Smokeless tobacco: Never Used  Substance Use Topics  . Alcohol use: No  . Drug use: No    Review of Systems  Constitutional: No fever/chills Eyes: No visual changes. ENT: No sore throat. Cardiovascular: Denies chest pain. Respiratory: Denies shortness of breath. Gastrointestinal: Positive for abdominal pain.  No nausea, no vomiting.  No diarrhea.  No constipation. Genitourinary: Negative for dysuria. Musculoskeletal: Negative for back pain. Skin: Negative for rash. Neurological: Negative for headaches, focal weakness or numbness.  ____________________________________________   PHYSICAL EXAM:  VITAL SIGNS: ED Triage Vitals  Enc Vitals  Group     BP 04/13/19 1356 114/74     Pulse Rate 04/13/19 1356 76     Resp 04/13/19 1356 16     Temp 04/13/19 1356 98.5 F (36.9 C)     Temp Source 04/13/19 1356 Oral     SpO2 04/13/19 1356 98 %     Weight 04/13/19 1357 150 lb (68 kg)     Height 04/13/19 1357 5\' 10"  (1.778 m)     Head Circumference --      Peak Flow --      Pain Score 04/13/19 1357 4     Pain Loc --      Pain Edu? --      Excl. in [place]? --     Constitutional: Alert and oriented. Eyes: Conjunctivae are normal.  Head: Atraumatic. Nose: No congestion/rhinnorhea. Mouth/Throat: Mucous membranes are moist. Neck: Normal ROM Cardiovascular: Normal rate, regular rhythm. Grossly normal heart sounds. Respiratory: Normal respiratory effort.  No retractions. Lungs CTAB. Gastrointestinal: Soft and tender in epigastrium with no rebound or guarding. No distention. Genitourinary: deferred Musculoskeletal: No lower extremity tenderness nor edema. Neurologic:  Normal speech and language. No gross focal neurologic deficits are appreciated. Skin:  Skin is warm, dry and intact. No rash noted. Psychiatric: Mood and affect are normal. Speech and behavior are normal.  ____________________________________________   LABS (all labs ordered are listed, but only abnormal results are displayed)  Labs Reviewed  COMPREHENSIVE METABOLIC PANEL - Abnormal; Notable for the following components:      Result Value   Creatinine, Ser 0.55 (*)    All other components within normal limits  URINALYSIS, COMPLETE (UACMP) WITH MICROSCOPIC - Abnormal; Notable for the following components:   Color, Urine YELLOW (*)    APPearance CLEAR (*)    All other components within normal limits  LIPASE, BLOOD  CBC     PROCEDURES  Procedure(s) performed (including Critical Care):  Procedures   ____________________________________________   INITIAL IMPRESSION / ASSESSMENT AND PLAN / ED COURSE       33 year old male with history of alcohol abuse presents to the ED complaining of epigastric and bilateral lower quadrant abdominal pain.  He has some epigastric tenderness but with no signs of peritonitis and otherwise benign abdominal exam.  Suspect his symptoms are related to gastritis related to his alcohol abuse.  CT scan not warranted at this time.  Counseled patient on cutting down on alcohol gradually.  He does not appear to be acutely withdrawing at this time, denies any issues with withdrawal in the past.  His labs today are  unremarkable, no evidence of GI bleeding.  Lipase and LFTs within normal limits.  Will treat with GI cocktail, awaiting UA results.  Patient would be appropriate for discharge with GI follow-up as planned, will start on PPI.  Patient reports improvement in abdominal pain following GI cocktail.  UA is unremarkable.  Will start patient on omeprazole, follow-up with GI as previously arranged.  Counseled patient to return to the ED for new or worsening symptoms, patient agrees with plan.      ____________________________________________   FINAL CLINICAL IMPRESSION(S) / ED DIAGNOSES  Final diagnoses:  Chronic alcoholic gastritis without hemorrhage  Alcohol abuse     ED Discharge Orders         Ordered    omeprazole (PRILOSEC) 20 MG capsule  Daily     04/13/19 1655           Note:  This document was prepared using  Dragon [employer] engineervoice recognition software and may include unintentional dictation errors.   Chesley NoonJessup, Franca Stakes, MD 04/13/19 [phone]

## 2019-04-13 NOTE — ED Notes (Signed)
NAD noted at time of D/C. Pt denies questions or concerns. Pt ambulatory to the lobby at this time.  

## 2019-04-20 ENCOUNTER — Telehealth: Payer: Self-pay

## 2019-04-20 NOTE — Telephone Encounter (Signed)
Called pt on 04/20/2019 at 2:21pm and scheduled an appt for pt to come in and get an application

## 2019-04-28 ENCOUNTER — Ambulatory Visit: Payer: Self-pay

## 2019-10-04 ENCOUNTER — Emergency Department
Admission: EM | Admit: 2019-10-04 | Discharge: 2019-10-05 | Disposition: A | Discharge status: Home / Self Care | Payer: Self-pay | Attending: Emergency Medicine | Admitting: Emergency Medicine

## 2019-10-04 ENCOUNTER — Other Ambulatory Visit: Payer: Self-pay

## 2019-10-04 ENCOUNTER — Encounter: Payer: Self-pay | Admitting: *Deleted

## 2019-10-04 DIAGNOSIS — J45909 Unspecified asthma, uncomplicated: Secondary | ICD-10-CM | POA: Insufficient documentation

## 2019-10-04 DIAGNOSIS — Z008 Encounter for other general examination: Secondary | ICD-10-CM

## 2019-10-04 DIAGNOSIS — F4320 Adjustment disorder, unspecified: Secondary | ICD-10-CM | POA: Insufficient documentation

## 2019-10-04 DIAGNOSIS — F332 Major depressive disorder, recurrent severe without psychotic features: Secondary | ICD-10-CM | POA: Insufficient documentation

## 2019-10-04 DIAGNOSIS — Z20822 Contact with and (suspected) exposure to covid-19: Secondary | ICD-10-CM | POA: Insufficient documentation

## 2019-10-04 DIAGNOSIS — F17210 Nicotine dependence, cigarettes, uncomplicated: Secondary | ICD-10-CM | POA: Insufficient documentation

## 2019-10-04 LAB — COMPREHENSIVE METABOLIC PANEL
ALT: 22 U/L (ref 0–44)
AST: 23 U/L (ref 15–41)
Albumin: 4.2 g/dL (ref 3.5–5.0)
Alkaline Phosphatase: 60 U/L (ref 38–126)
Anion gap: 9 (ref 5–15)
BUN: 21 mg/dL — ABNORMAL HIGH (ref 6–20)
CO2: 29 mmol/L (ref 22–32)
Calcium: 8.8 mg/dL — ABNORMAL LOW (ref 8.9–10.3)
Chloride: 101 mmol/L (ref 98–111)
Creatinine, Ser: 0.93 mg/dL (ref 0.61–1.24)
GFR calc Af Amer: >60 mL/min (ref >60)
GFR calc non Af Amer: >60 mL/min (ref >60)
Glucose, Bld: 104 mg/dL — ABNORMAL HIGH (ref 70–99)
Potassium: 3.7 mmol/L (ref 3.5–5.1)
Sodium: 139 mmol/L (ref 135–145)
Total Bilirubin: 0.5 mg/dL (ref 0.3–1.2)
Total Protein: 6.8 g/dL (ref 6.5–8.1)

## 2019-10-04 LAB — SALICYLATE LEVEL: Salicylate Lvl: <7 mg/dL — ABNORMAL LOW (ref 7.0–30.0)

## 2019-10-04 LAB — CBC
HCT: 42.9 % (ref 39.0–52.0)
Hemoglobin: 15.1 g/dL (ref 13.0–17.0)
MCH: 32.1 pg (ref 26.0–34.0)
MCHC: 35.2 g/dL (ref 30.0–36.0)
MCV: 91.3 fL (ref 80.0–100.0)
Platelets: 230 10*3/uL (ref 150–400)
RBC: 4.7 MIL/uL (ref 4.22–5.81)
RDW: 12.3 % (ref 11.5–15.5)
WBC: 7.1 10*3/uL (ref 4.0–10.5)
nRBC: 0 % (ref 0.0–0.2)

## 2019-10-04 LAB — ACETAMINOPHEN LEVEL: Acetaminophen (Tylenol), Serum: <10 ug/mL — ABNORMAL LOW (ref 10–30)

## 2019-10-04 LAB — ETHANOL: Alcohol, Ethyl (B): <10 mg/dL (ref <10)

## 2019-10-04 NOTE — ED Notes (Signed)
Pt. Alert and oriented, warm and dry, in no distress. Pt. Denies SI, HI, and AVH. Pt states this whole thing id over a facebook post that someone took out of context due to him using the word expire. Patient states he was talking about moving to [place] to his fathers home. Patient states he was charged yesterday by his ex girlfriend for communicating threats. Patient denies threats to ex girlfriend. Pt. Encouraged to let nursing staff know of any concerns or needs.

## 2019-10-04 NOTE — ED Triage Notes (Signed)
Pt to ED under IVC after posting a message on Facebook that raised alarms to family that patient was suicidal. PT stated in post, "Well time is calling and telling me its time to go home I said 5 more mins got some stuff to take care of the only thing I ask of anyone is to make sure my kids are good and stay straight my time here is fixing to expire but in this lifetime I have learned that no one is truly there for you except your kids so always make sure they're #1 and stay #1 im straight out yall its been real and its been fun but half yall aint real and its been fun learning the truth, sorry but I have to go make sure my kids know I love them and make sure they know I did the best I I could...Marland KitchenMarland Kitchen" The post continues further with similar language. Pt reports this post was in referance to him moving away from [facility] but he didn't want his ex-girlfriend to know the specifics because she filed charges yesterday that he was assaulting her. (pt denies this as well).  Pt has a hx of SI but sought treatment and was placed in [facility] for treatment that pt reports was helpful.   In the IVC it is said that pt called his mother and told her not to come to the storage unit and if she came not to come alone. Pt reports he never said this and he told her to come to the storage unit and to not come alone because he needed extra hands to help move out if the storage unit.

## 2019-10-04 NOTE — ED Notes (Signed)

## 2019-10-04 NOTE — BH Assessment (Signed)
Assessment Note  Jamie Hudson is an 34 y.o. male. Who presents to the ER after being placed under under IVC after posting a message on Facebook that raised alarms to family that patient was suicidal. The patient is alert and oriented x 4, anxious, irritated but cooperative, and mood-congruent with affect on evaluationPt states that was a misinterpretation of his words. He shares that he states " My time here has expired." He share that he plans to relocated due to relationship conflict and that this was his way of say that he was leaving the area. Pt. denies any suicidal ideation, plan or intent. Pt. denies the presence of any auditory or visual hallucinations at this time. Patient denies any other medical complaints. Pt has a hx of one previous inpatient admission in 2020 for depressed mood but denied any previous history of suicide attempts. He states that he currently [place]per [place]. A behavioral health assessment has been completed including evaluation of the patient, collecting collateral history:, reviewing available medical/clinic records, evaluating his unique risk and protective factors, and discussing treatment recommendations.      Diagnosis: Adjustment Disorder  Past Medical History:  Past Medical History:  Diagnosis Date  . Asthma   . Kidney stones     History reviewed. No pertinent surgical history.  Family History:  Family History  Problem Relation Age of Onset  . Cancer Maternal Aunt   . Kidney failure Maternal Grandmother     Social History:  reports that he has been smoking cigarettes. He has a 15.00 pack-year smoking history. He has never used smokeless tobacco. He reports that he does not drink alcohol or use drugs.  Additional Social History:  Alcohol / Drug Use Pain Medications: SEE PTA Prescriptions: SEE PTA Over the Counter: SEE PTA History of alcohol / drug use?: Yes Substance #1 Name of Substance 1: thc 1 - Age of First Use: PAST  13 YEARS 1 - Amount (size/oz): 2-3 bowls 1 - Frequency: Dialy 1 - Duration: ongoing 1 - Last Use / Amount: Pta  CIWA: CIWA-Ar BP: (!) 137/104 Pulse Rate: 93 COWS:    Allergies: No Known Allergies  Home Medications: (Not in a hospital admission)   OB/GYN Status:  No LMP for male patient.  General Assessment Data TTS Assessment: In system Is this a Tele or Face-to-Face Assessment?: Tele Assessment Is this an Initial Assessment or a Re-assessment for this encounter?: Initial Assessment Patient Accompanied by:: N/A Language Other than English: No Living Arrangements: Other (Comment) What gender do you identify as?: Male Marital status: Single Living Arrangements: Alone Can pt return to current living arrangement?: Yes Admission Status: Involuntary Petitioner: Other Is patient capable of signing voluntary admission?: No Referral Source: Other Insurance type: None   Medical Screening Exam [facility]) Medical Exam completed: Yes  Crisis Care Plan Living Arrangements: Alone Legal Guardian: Other:(Self) Name of Psychiatrist: None  Name of Therapist: None   Education Status Is patient currently in school?: No Is the patient employed, unemployed or receiving disability?: Employed  Risk to self with the past 6 months Suicidal Ideation: No Has patient been a risk to self within the past 6 months prior to admission? : No Suicidal Intent: No Has patient had any suicidal intent within the past 6 months prior to admission? : No Is patient at risk for suicide?: No, but patient needs Medical Clearance Suicidal Plan?: No Has patient had any suicidal plan within the past 6 months prior to  admission? : No Access to Means: No What has been your use of drugs/alcohol within the last 12 months?: n Previous Attempts/Gestures: Yes How many times?: 0 Other Self Harm Risks: none Intentional Self Injurious Behavior: None Family Suicide History: No Recent stressful life  event(s): Conflict (Comment) Persecutory voices/beliefs?: No Depression: Yes Depression Symptoms: Feeling angry/irritable Substance abuse history and/or treatment for substance abuse?: Yes Suicide prevention information given to non-admitted patients: Not applicable  Risk to Others within the past 6 months Homicidal Ideation: No Does patient have any lifetime risk of violence toward others beyond the six months prior to admission? : No Thoughts of Harm to Others: No Current Homicidal Intent: No Current Homicidal Plan: No Access to Homicidal Means: No History of harm to others?: No Assessment of Violence: None Noted Does patient have access to weapons?: No Criminal Charges Pending?: No Does patient have a court date: No Is patient on probation?: No  Psychosis Hallucinations: None noted Delusions: None noted  Mental Status Report Appearance/Hygiene: In scrubs Eye Contact: Poor Motor Activity: Freedom of movement Speech: Logical/coherent Level of Consciousness: Alert Mood: Anxious Affect: Sad Anxiety Level: Minimal Thought Processes: Relevant Judgement: Partial Orientation: Person, Situation, Time, Place Obsessive Compulsive Thoughts/Behaviors: None  Cognitive Functioning Concentration: Fair Memory: Remote Intact, Recent Intact Is patient IDD: No Insight: Poor Impulse Control: Poor Appetite: Poor Have you had any weight changes? : No Change Sleep: No Change Total Hours of Sleep: 8 Vegetative Symptoms: None  ADLScreening [facility] Assessment Services) Patient's cognitive ability adequate to safely complete daily activities?: Yes Patient able to express need for assistance with ADLs?: Yes Independently performs ADLs?: Yes (appropriate for developmental age)  Prior Inpatient Therapy Prior Inpatient Therapy: Yes Prior Therapy Dates: 2020 Prior Therapy Facilty/Provider(s): [facility]  Reason for Treatment: Depression Si   Prior Outpatient Therapy Prior  Outpatient Therapy: No Does patient have an ACCT team?: No Does patient have Intensive In-House Services?  : No Does patient have [facility] services? : No Does patient have P4CC services?: No  ADL Screening (condition at time of admission) Patient's cognitive ability adequate to safely complete daily activities?: Yes Patient able to express need for assistance with ADLs?: Yes Independently performs ADLs?: Yes (appropriate for developmental age)       Abuse/Neglect Assessment (Assessment to be complete while patient is alone) Abuse/Neglect Assessment Can Be Completed: Yes Physical Abuse: Denies Verbal Abuse: Denies Sexual Abuse: Denies Exploitation of patient/patient's resources: Denies Self-Neglect: Denies   Consults Spiritual Care Consult Needed: No Transition of Care Team Consult Needed: No Advance Directives (For Healthcare) Does Patient Have a Medical Advance Directive?: No Would patient like information on creating a medical advance directive?: No - Patient declined          Disposition:  Disposition Initial Assessment Completed for this Encounter: Yes Patient referred to: Other (Comment)(Conslut with NP)  On Site Evaluation by:   Reviewed with Physician:    Laretta Alstrom 10/04/2019 10:36 PM
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
## 2019-10-04 NOTE — ED Provider Notes (Signed)
[facility] Emergency Department Provider Note  ____________________________________________   First MD Initiated Contact with Patient 10/04/19 2022     (approximate)  I have reviewed the triage vital signs and the nursing notes.   HISTORY  Chief Complaint IVC    HPI Jamie Hudson is a 34 y.o. male who comes in under IVC for concerns for suicidal ideations.  Patient stated that he posted on Facebook this time here was done.  However he meant that he was going to move to [place] to be closer to his dad and brother.  He states that he was not having SI.  However there was some concern with police that he was lying they was making threats to his ex-girlfriend and being elusive with police.  Patient denies any HI SI auditory hallucinations.  He states he only uses THC.  No alcohol or drug use.  Otherwise denies any medical concerns          Past Medical History:  Diagnosis Date  . Asthma   . Kidney stones     Patient Active Problem List   Diagnosis Date Noted  . Recurrent nephrolithiasis 03/09/2015    History reviewed. No pertinent surgical history.  Prior to Admission medications   Medication Sig Start Date End Date Taking? Authorizing Provider  albuterol (PROVENTIL HFA;VENTOLIN HFA) 108 (90 Base) MCG/ACT inhaler Inhale 2 puffs into the lungs every 6 (six) hours as needed for wheezing or shortness of breath. 03/11/16   Triplett, Cari B, FNP  famotidine (PEPCID) 20 MG tablet Take 1 tablet (20 mg total) by mouth 2 (two) times daily. Patient not taking: Reported on 04/13/2019 11/28/18   Sharman Cheek, MD  ibuprofen (ADVIL,MOTRIN) 200 MG tablet Take 200 mg by mouth every 6 (six) hours as needed.    [provider]  omeprazole (PRILOSEC) 20 MG capsule Take 1 capsule (20 mg total) by mouth daily. 04/13/19 06/12/19  Chesley Noon, MD  sucralfate (CARAFATE) 1 g tablet Take 1 tablet (1 g total) by mouth 4 (four) times daily. Patient not taking:  Reported on 04/13/2019 11/28/18   Sharman Cheek, MD    Allergies Patient has no known allergies.  Family History  Problem Relation Age of Onset  . Cancer Maternal Aunt   . Kidney failure Maternal Grandmother     Social History Social History   Tobacco Use  . Smoking status: Current Every Day Smoker    Packs/day: 1.00    Years: 15.00    Pack years: 15.00    Types: Cigarettes  . Smokeless tobacco: Never Used  Substance Use Topics  . Alcohol use: No  . Drug use: No      Review of Systems Constitutional: No fever/chills Eyes: No visual changes. ENT: No sore throat. Cardiovascular: Denies chest pain. Respiratory: Denies shortness of breath. Gastrointestinal: No abdominal pain.  No nausea, no vomiting.  No diarrhea.  No constipation. Genitourinary: Negative for dysuria. Musculoskeletal: Negative for back pain. Skin: Negative for rash. Neurological: Negative for headaches, focal weakness or numbness. Psych: Positive concern for SI, IVC All other ROS negative ____________________________________________   PHYSICAL EXAM:  VITAL SIGNS: ED Triage Vitals  Enc Vitals Group     BP 10/04/19 2003 (!) 137/104     Pulse Rate 10/04/19 2003 93     Resp 10/04/19 2003 16     Temp 10/04/19 2003 98.8 F (37.1 C)     Temp Source 10/04/19 2003 Oral     SpO2 10/04/19 2003 97 %  Weight --      Height --      Head Circumference --      Peak Flow --      Pain Score 10/04/19 1929 0     Pain Loc --      Pain Edu? --      Excl. in GC? --     Constitutional: Alert and oriented. Well appearing and in no acute distress. Eyes: Conjunctivae are normal. EOMI. Head: Atraumatic. Nose: No congestion/rhinnorhea. Mouth/Throat: Mucous membranes are moist.   Neck: No stridor. Trachea Midline. FROM Cardiovascular: Normal rate, regular rhythm. Grossly normal heart sounds.  Good peripheral circulation. Respiratory: Normal respiratory effort.  No retractions. Lungs  CTAB. Gastrointestinal: Soft and nontender. No distention. No abdominal bruits.  Musculoskeletal: No lower extremity tenderness nor edema.  No joint effusions. Neurologic:  Normal speech and language. No gross focal neurologic deficits are appreciated.  Skin:  Skin is warm, dry and intact. No rash noted. Psychiatric: Mood and affect are normal. Speech and behavior are normal. GU: Deferred   ____________________________________________   LABS (all labs ordered are listed, but only abnormal results are displayed)  Labs Reviewed  COMPREHENSIVE METABOLIC PANEL - Abnormal; Notable for the following components:      Result Value   Glucose, Bld 104 (*)    BUN 21 (*)    Calcium 8.8 (*)    All other components within normal limits  SALICYLATE LEVEL - Abnormal; Notable for the following components:   Salicylate Lvl <7.0 (*)    All other components within normal limits  ACETAMINOPHEN LEVEL - Abnormal; Notable for the following components:   Acetaminophen (Tylenol), Serum <10 (*)    All other components within normal limits  ETHANOL  CBC  URINE DRUG SCREEN, QUALITATIVE ([facility] ONLY)   ____________________________________________    INITIAL IMPRESSION / ASSESSMENT AND PLAN / ED COURSE  Jamie Hudson was evaluated in Emergency Department on 10/04/2019 for the symptoms described in the history of present illness. He was evaluated in the context of the global COVID-19 pandemic, which necessitated consideration that the patient might be at risk for infection with the SARS-CoV-2 virus that causes COVID-19. Institutional protocols and algorithms that pertain to the evaluation of patients at risk for COVID-19 are in a state of rapid change based on information released by regulatory bodies including the CDC and federal and state organizations. These policies and algorithms were followed during the patient's care in the ED.     Pt is without any acute medical complaints. No exam findings to suggest  medical cause of current presentation. Will order psychiatric screening labs and discuss further w/ psychiatric service.  D/d includes but is not limited to psychiatric disease, behavioral/personality disorder, inadequate socioeconomic support, medical.  Based on HPI, exam, unremarkable labs, no concern for acute medical problem at this time. No rigidity, clonus, hyperthermia, focal neurologic deficit, diaphoresis, tachycardia, meningismus, ataxia, gait abnormality or other finding to suggest this visit represents a non-psychiatric problem. Screening labs reviewed.    Given this, pt medically cleared, to be dispositioned per Psych.        ____________________________________________   FINAL CLINICAL IMPRESSION(S) / ED DIAGNOSES   Final diagnoses:  Evaluation by psychiatric service required      MEDICATIONS GIVEN DURING THIS VISIT:  Medications - No data to display   ED Discharge Orders    None       Note:  This document was prepared using Dragon voice recognition software and may include unintentional  dictation errors.   Vanessa Glen Flora, MD 10/04/19 2111

## 2019-10-05 LAB — RESPIRATORY PANEL BY RT PCR (FLU A&B, COVID)
Influenza A by PCR: NEGATIVE
Influenza B by PCR: NEGATIVE
SARS Coronavirus 2 by RT PCR: NEGATIVE

## 2019-10-05 NOTE — ED Notes (Signed)
Patient given breakfast tray.

## 2019-10-05 NOTE — BH Assessment (Signed)
[facility] Assessment Progress Note  Per Pricilla Larsson, MD, this pt does not require psychiatric hospitalization at this time.  Pt is to be discharged from [facility] ED with recommendation to continue treatment with his current outpatient provider.  This has been included in pt's discharge instructions.  Pt's nurse has been notified.  Doylene Canning, MA Triage Specialist [phone]

## 2019-10-05 NOTE — ED Notes (Signed)
IVC  PAPERS  RESCINDED  INFORMED  RN  Geralynn Ochs
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
## 2019-10-05 NOTE — ED Notes (Signed)
Hourly rounding reveals patient sleeping in room. No complaints, stable, in no acute distress. Q15 minute rounds and monitoring via Security Cameras to continue. 

## 2019-10-05 NOTE — ED Notes (Signed)
Patient talking to psychiatrist  

## 2019-10-05 NOTE — ED Notes (Signed)
Pt. Transferred to B[facility] from ED to room 4 after screening for contraband. Report to include Situation, Background, Assessment and Recommendations from Kim RN. Pt. Oriented to unit including Q15 minute rounds as well as the security cameras for their protection. Patient is alert and oriented, warm and dry in no acute distress. Patient denies SI, HI, and AVH. Pt. Encouraged to let me know if needs arise.  

## 2019-10-05 NOTE — Discharge Instructions (Addendum)
For your behavioral health needs, you are advised to continue treatment with your current outpatient provider. °

## 2019-10-05 NOTE — ED Provider Notes (Signed)
-----------------------------------------   6:07 AM on 10/05/2019 -----------------------------------------   Blood pressure (!) 137/104, pulse 93, temperature 98.8 F (37.1 C), temperature source Oral, resp. rate 16, SpO2 97 %.  The patient is calm and cooperative at this time.  There have been no acute events since the last update.  Awaiting disposition plan from Behavioral Medicine and/or Social Work team(s).   Irean Hong, MD 10/05/19 [phone]

## 2019-12-23 ENCOUNTER — Emergency Department: Payer: Self-pay

## 2019-12-23 ENCOUNTER — Other Ambulatory Visit: Payer: Self-pay

## 2019-12-23 DIAGNOSIS — Y929 Unspecified place or not applicable: Secondary | ICD-10-CM | POA: Insufficient documentation

## 2019-12-23 DIAGNOSIS — Y999 Unspecified external cause status: Secondary | ICD-10-CM | POA: Insufficient documentation

## 2019-12-23 DIAGNOSIS — S61412A Laceration without foreign body of left hand, initial encounter: Secondary | ICD-10-CM | POA: Insufficient documentation

## 2019-12-23 DIAGNOSIS — Z5321 Procedure and treatment not carried out due to patient leaving prior to being seen by health care provider: Secondary | ICD-10-CM | POA: Insufficient documentation

## 2019-12-23 DIAGNOSIS — Y9389 Activity, other specified: Secondary | ICD-10-CM | POA: Insufficient documentation

## 2019-12-23 NOTE — ED Notes (Signed)
Pt updated on wait time. Pt verbalizes understanding and back outside.
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
## 2019-12-24 ENCOUNTER — Emergency Department
Admission: EM | Admit: 2019-12-24 | Discharge: 2019-12-24 | Disposition: A | Discharge status: Home / Self Care | Payer: Self-pay | Attending: Emergency Medicine | Admitting: Emergency Medicine

## 2019-12-24 NOTE — ED Notes (Signed)
Pt not in lobby.  

## 2019-12-24 NOTE — ED Notes (Signed)
No answer  when called in lobby 

## 2019-12-25 ENCOUNTER — Ambulatory Visit: Payer: Self-pay

## 2019-12-26 ENCOUNTER — Encounter: Payer: Self-pay | Admitting: Emergency Medicine

## 2019-12-26 ENCOUNTER — Other Ambulatory Visit: Payer: Self-pay

## 2019-12-26 ENCOUNTER — Emergency Department
Admission: EM | Admit: 2019-12-26 | Discharge: 2019-12-26 | Disposition: A | Discharge status: Home / Self Care | Payer: Self-pay | Attending: Emergency Medicine | Admitting: Emergency Medicine

## 2019-12-26 ENCOUNTER — Emergency Department: Payer: Self-pay

## 2019-12-26 DIAGNOSIS — S0993XA Unspecified injury of face, initial encounter: Secondary | ICD-10-CM

## 2019-12-26 DIAGNOSIS — Z79899 Other long term (current) drug therapy: Secondary | ICD-10-CM | POA: Insufficient documentation

## 2019-12-26 DIAGNOSIS — F17210 Nicotine dependence, cigarettes, uncomplicated: Secondary | ICD-10-CM | POA: Insufficient documentation

## 2019-12-26 DIAGNOSIS — Y9289 Other specified places as the place of occurrence of the external cause: Secondary | ICD-10-CM | POA: Insufficient documentation

## 2019-12-26 DIAGNOSIS — Y999 Unspecified external cause status: Secondary | ICD-10-CM | POA: Insufficient documentation

## 2019-12-26 DIAGNOSIS — Y9389 Activity, other specified: Secondary | ICD-10-CM | POA: Insufficient documentation

## 2019-12-26 DIAGNOSIS — Z23 Encounter for immunization: Secondary | ICD-10-CM | POA: Insufficient documentation

## 2019-12-26 DIAGNOSIS — S0181XA Laceration without foreign body of other part of head, initial encounter: Secondary | ICD-10-CM | POA: Insufficient documentation

## 2019-12-26 DIAGNOSIS — S61412A Laceration without foreign body of left hand, initial encounter: Secondary | ICD-10-CM | POA: Insufficient documentation

## 2019-12-26 DIAGNOSIS — K029 Dental caries, unspecified: Secondary | ICD-10-CM | POA: Insufficient documentation

## 2019-12-26 MED ORDER — TRAMADOL HCL 50 MG PO TABS
50.0000 mg | ORAL_TABLET | Freq: Once | ORAL | Status: AC
  Administered 2019-12-26: 50 mg via ORAL
  Filled 2019-12-26: qty 1

## 2019-12-26 MED ORDER — TRAMADOL HCL 50 MG PO TABS: 50.0000 mg | ORAL_TABLET | Freq: Four times a day (QID) | ORAL | 0 refills | Status: AC | PRN

## 2019-12-26 MED ORDER — TETANUS-DIPHTH-ACELL PERTUSSIS 5-2.5-18.5 LF-MCG/0.5 IM SUSP
0.5000 mL | Freq: Once | INTRAMUSCULAR | Status: AC
  Administered 2019-12-26: 0.5 mL via INTRAMUSCULAR
  Filled 2019-12-26: qty 0.5

## 2019-12-26 MED ORDER — CEPHALEXIN 500 MG PO CAPS: 500.0000 mg | ORAL_CAPSULE | Freq: Two times a day (BID) | ORAL | 0 refills | Status: AC

## 2019-12-26 MED ORDER — BACITRACIN-NEOMYCIN-POLYMYXIN 400-5-5000 EX OINT
TOPICAL_OINTMENT | Freq: Once | CUTANEOUS | Status: AC
  Administered 2019-12-26: 1 via TOPICAL
  Filled 2019-12-26: qty 1
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
## 2022-08-19 ENCOUNTER — Ambulatory Visit
Admission: EM | Admit: 2022-08-19 | Discharge: 2022-08-19 | Disposition: A | Discharge status: Home / Self Care | Payer: Self-pay

## 2022-08-19 DIAGNOSIS — J101 Influenza due to other identified influenza virus with other respiratory manifestations: Secondary | ICD-10-CM

## 2022-08-19 MED ORDER — ONDANSETRON 8 MG PO TBDP: 8.0000 mg | ORAL_TABLET | Freq: Three times a day (TID) | ORAL | 0 refills | Status: AC | PRN

## 2022-08-19 MED ORDER — IPRATROPIUM BROMIDE 0.06 % NA SOLN: 2.0000 | Freq: Four times a day (QID) | NASAL | 12 refills | Status: AC

## 2022-08-19 MED ORDER — BENZONATATE 100 MG PO CAPS: 200.0000 mg | ORAL_CAPSULE | Freq: Three times a day (TID) | ORAL | 0 refills | Status: AC

## 2022-08-19 MED ORDER — PROMETHAZINE-DM 6.25-15 MG/5ML PO SYRP: 5.0000 mL | ORAL_SOLUTION | Freq: Four times a day (QID) | ORAL | 0 refills | Status: AC | PRN

## 2022-08-19 NOTE — ED Triage Notes (Addendum)
Pt c/o emesis, nausea, headache, body aches onset x3-4 days ago. Pt states he was exposed to the flu

## 2022-08-19 NOTE — ED Provider Notes (Signed)
[facility]    CSN: [id number] Arrival date & time: 08/19/22  1241      History   Chief Complaint Chief Complaint  Patient presents with   Emesis   Fever   Headache    HPI Jamie Hudson is a 37 y.o. male.   HPI  37 year old male here for evaluation of respiratory complaints.  Patient reports that for last 3 to 4 days he has been experiencing a fever with a Tmax of 102.4, runny nose and nasal congestion, headache, nonproductive cough, and has had several episodes of vomiting.  He denies any diarrhea.  He was seen at [facility] but left before being evaluated.  He did have a respiratory panel collected at [facility] and it is positive for influenza B.  Past Medical History:  Diagnosis Date   Asthma    Kidney stones     Patient Active Problem List   Diagnosis Date Noted   MDD (major depressive disorder), recurrent episode, severe ([place]) 10/04/2019   Recurrent nephrolithiasis 03/09/2015    History reviewed. No pertinent surgical history.     Home Medications    Prior to Admission medications   Medication Sig Start Date End Date Taking? Authorizing Provider  benzonatate (TESSALON) 100 MG capsule Take 2 capsules (200 mg total) by mouth every 8 (eight) hours. 08/19/22  Yes Margarette Canada, NP  ipratropium (ATROVENT) 0.06 % nasal spray Place 2 sprays into both nostrils 4 (four) times daily. 08/19/22  Yes Margarette Canada, NP  Lacosamide 150 MG TABS SMARTSIG:1 Tablet(s) By Mouth Every 12 Hours   Yes [provider]  ondansetron (ZOFRAN-ODT) 8 MG disintegrating tablet Take 1 tablet (8 mg total) by mouth every 8 (eight) hours as needed for nausea or vomiting. 08/19/22  Yes Margarette Canada, NP  promethazine-dextromethorphan (PROMETHAZINE-DM) 6.25-15 MG/5ML syrup Take 5 mLs by mouth 4 (four) times daily as needed. 08/19/22  Yes Margarette Canada, NP  albuterol (PROVENTIL HFA;VENTOLIN HFA) 108 (90 Base) MCG/ACT inhaler Inhale 2 puffs into the lungs every 6 (six) hours as needed for wheezing or  shortness of breath. 03/11/16   Triplett, Cari B, FNP  ibuprofen (ADVIL,MOTRIN) 200 MG tablet Take 200 mg by mouth every 6 (six) hours as needed.    [provider]  omeprazole (PRILOSEC) 20 MG capsule Take 1 capsule (20 mg total) by mouth daily. 04/13/19 06/12/19  Blake Divine, MD  traMADol (ULTRAM) 50 MG tablet Take 1 tablet (50 mg total) by mouth every 6 (six) hours as needed. 12/26/19   Victorino Dike, FNP    Family History Family History  Problem Relation Age of Onset   Cancer Maternal Aunt    Kidney failure Maternal Grandmother     Social History Social History   Tobacco Use   Smoking status: Every Day    Packs/day: 1.00    Years: 15.00    Total pack years: 15.00    Types: Cigarettes   Smokeless tobacco: Never  Substance Use Topics   Alcohol use: No   Drug use: No     Allergies   Patient has no known allergies.   Review of Systems Review of Systems  Constitutional:  Positive for fever.  HENT:  Positive for congestion, rhinorrhea and sore throat.   Respiratory:  Positive for cough. Negative for shortness of breath and wheezing.   Gastrointestinal:  Positive for nausea and vomiting. Negative for diarrhea.  Musculoskeletal:  Positive for arthralgias and myalgias.  Neurological:  Positive for headaches.  Physical Exam Triage Vital Signs ED Triage Vitals  Enc Vitals Group     BP      Pulse      Resp      Temp      Temp src      SpO2      Weight      Height      Head Circumference      Peak Flow      Pain Score      Pain Loc      Pain Edu?      Excl. in [place]?    No data found.  Updated Vital Signs BP 103/71   Pulse 93   Temp 99.2 F (37.3 C) (Oral)   Ht 5\' 9"  (1.753 m)   Wt 140 lb (63.5 kg)   SpO2 95%   BMI 20.67 kg/m   Visual Acuity Right Eye Distance:   Left Eye Distance:   Bilateral Distance:    Right Eye Near:   Left Eye Near:    Bilateral Near:     Physical Exam Vitals and nursing note reviewed.  Constitutional:       Appearance: Normal appearance. He is ill-appearing.  HENT:     Head: Normocephalic and atraumatic.     Right Ear: Tympanic membrane, ear canal and external ear normal. There is no impacted cerumen.     Left Ear: Tympanic membrane, ear canal and external ear normal. There is no impacted cerumen.     Nose: Congestion and rhinorrhea present.     Comments: Nasal mucosa is erythematous and edematous with clear discharge in both nares.    Mouth/Throat:     Mouth: Mucous membranes are moist.     Pharynx: Oropharynx is clear. Posterior oropharyngeal erythema present. No oropharyngeal exudate.     Comments: Erythema and injection to the posterior pharynx with clear postnasal drip. Cardiovascular:     Rate and Rhythm: Normal rate and regular rhythm.     Pulses: Normal pulses.     Heart sounds: Normal heart sounds. No murmur heard.    No friction rub. No gallop.  Pulmonary:     Effort: Pulmonary effort is normal.     Breath sounds: Normal breath sounds. No wheezing, rhonchi or rales.  Musculoskeletal:     Cervical back: Normal range of motion and neck supple.  Lymphadenopathy:     Cervical: No cervical adenopathy.  Skin:    General: Skin is warm and dry.     Capillary Refill: Capillary refill takes less than 2 seconds.     Findings: No erythema or rash.  Neurological:     General: No focal deficit present.     Mental Status: He is alert and oriented to person, place, and time.  Psychiatric:        Mood and Affect: Mood normal.        Behavior: Behavior normal.        Thought Content: Thought content normal.        Judgment: Judgment normal.      UC Treatments / Results  Labs (all labs ordered are listed, but only abnormal results are displayed) Labs Reviewed - No data to display  EKG   Radiology No results found.  Procedures Procedures (including critical care time)  Medications Ordered in UC Medications - No data to display  Initial Impression / Assessment and Plan  / UC Course  I have reviewed the triage vital signs and the nursing notes.  Pertinent labs & imaging results that were available during my care of the patient were reviewed by me and considered in my medical decision making (see chart for details).   Is a pleasant, though ill-appearing 37 year old male here for evaluation of 3 to 4 days worth of respiratory symptoms as outlined in HPI above.  He was evaluated in the ER at [facility] yesterday but he left prior to seeing a physician.  He did have a respiratory panel collected that was positive for influenza B.  I have advised the patient that he is outside the window for Tamiflu as he has had symptoms for 3 to 4 days but I can give him medication to help with his symptoms.  I will give him Zofran 8 mg ODT's that he can use every 8 hours as needed for nausea and vomiting.  Atrovent nasal spray to help with the nasal congestion.  Tessalon Perles and Promethazine DM cough syrup to help with cough and congestion.  He does not currently have a primary care provider so I advised him that if his symptoms do not improve he needs to return for reevaluation.   Final Clinical Impressions(s) / UC Diagnoses   Final diagnoses:  Influenza B     Discharge Instructions      You tested positive for influenza B but are outside of the window for Tamiflu.  Take the Zofran every 8 hours as needed for nausea.  Continue to use OTC Tylenol and or Ibuprofen as needed for pain and fever.   Use the Atrovent nasal spray, 2 squirts up each nostril every 6 hours, as needed for nasal congestion and runny nose.  Use over-the-counter Delsym, Zarbee's, or Robitussin during the day as needed for cough.  Use the Tessalon Perles every 8 hours as needed for cough.  Taken with a small sip of water.  You may experience some numbness to your tongue or metallic taste in her mouth, this is normal.  Use the Promethazine DM cough syrup at bedtime as will make you drowsy but it should help  dry up your postnasal drip and aid you in sleep and cough relief.  Return for reevaluation, or see your primary care provider, for new or worsening symptoms.      ED Prescriptions     Medication Sig Dispense Auth. Provider   benzonatate (TESSALON) 100 MG capsule Take 2 capsules (200 mg total) by mouth every 8 (eight) hours. 21 capsule Margarette Canada, NP   ipratropium (ATROVENT) 0.06 % nasal spray Place 2 sprays into both nostrils 4 (four) times daily. 15 mL Margarette Canada, NP   promethazine-dextromethorphan (PROMETHAZINE-DM) 6.25-15 MG/5ML syrup Take 5 mLs by mouth 4 (four) times daily as needed. 118 mL Margarette Canada, NP   ondansetron (ZOFRAN-ODT) 8 MG disintegrating tablet Take 1 tablet (8 mg total) by mouth every 8 (eight) hours as needed for nausea or vomiting. 20 tablet Margarette Canada, NP      PDMP not reviewed this encounter.   Margarette Canada, NP 08/19/22 1339

## 2022-08-19 NOTE — Discharge Instructions (Signed)
You tested positive for influenza B but are outside of the window for Tamiflu.  Take the Zofran every 8 hours as needed for nausea.  Continue to use OTC Tylenol and or Ibuprofen as needed for pain and fever.   Use the Atrovent nasal spray, 2 squirts up each nostril every 6 hours, as needed for nasal congestion and runny nose.  Use over-the-counter Delsym, Zarbee's, or Robitussin during the day as needed for cough.  Use the Tessalon Perles every 8 hours as needed for cough.  Taken with a small sip of water.  You may experience some numbness to your tongue or metallic taste in her mouth, this is normal.  Use the Promethazine DM cough syrup at bedtime as will make you drowsy but it should help dry up your postnasal drip and aid you in sleep and cough relief.  Return for reevaluation, or see your primary care provider, for new or worsening symptoms.

## 2023-06-07 ENCOUNTER — Emergency Department
Admission: EM | Admit: 2023-06-07 | Discharge: 2023-06-08 | Disposition: A | Discharge status: Home / Self Care | Payer: Self-pay | Attending: Emergency Medicine | Admitting: Emergency Medicine

## 2023-06-07 ENCOUNTER — Other Ambulatory Visit: Payer: Self-pay

## 2023-06-07 ENCOUNTER — Emergency Department: Payer: Self-pay

## 2023-06-07 DIAGNOSIS — J45909 Unspecified asthma, uncomplicated: Secondary | ICD-10-CM | POA: Insufficient documentation

## 2023-06-07 DIAGNOSIS — Z87442 Personal history of urinary calculi: Secondary | ICD-10-CM | POA: Insufficient documentation

## 2023-06-07 DIAGNOSIS — S0990XA Unspecified injury of head, initial encounter: Secondary | ICD-10-CM | POA: Insufficient documentation

## 2023-06-07 DIAGNOSIS — Y92410 Unspecified street and highway as the place of occurrence of the external cause: Secondary | ICD-10-CM | POA: Insufficient documentation

## 2023-06-07 MED ORDER — IBUPROFEN 800 MG PO TABS
800.0000 mg | ORAL_TABLET | Freq: Once | ORAL | Status: AC
  Administered 2023-06-07: 800 mg via ORAL
  Filled 2023-06-07: qty 1

## 2023-06-07 NOTE — Discharge Instructions (Addendum)
You may alternate Tylenol 1000 mg every 6 hours as needed for pain, fever and Ibuprofen 800 mg every 6-8 hours as needed for pain, fever.  Please take Ibuprofen with food.  Do not take more than 4000 mg of Tylenol (acetaminophen) in a 24 hour period.
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
## 2023-06-07 NOTE — ED Notes (Signed)
ED Provider at bedside. 
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
## 2024-07-20 ENCOUNTER — Other Ambulatory Visit: Payer: Self-pay | Admitting: Physician Assistant

## 2024-07-20 DIAGNOSIS — R2689 Other abnormalities of gait and mobility: Secondary | ICD-10-CM

## 2024-07-20 DIAGNOSIS — M79604 Pain in right leg: Secondary | ICD-10-CM

## 2024-07-20 DIAGNOSIS — R29898 Other symptoms and signs involving the musculoskeletal system: Secondary | ICD-10-CM

## 2024-07-20 DIAGNOSIS — R569 Unspecified convulsions: Secondary | ICD-10-CM

## 2024-07-30 ENCOUNTER — Ambulatory Visit: Admission: RE | Admit: 2024-07-30

## 2024-08-02 ENCOUNTER — Ambulatory Visit

## 2024-08-02 DIAGNOSIS — R2689 Other abnormalities of gait and mobility: Secondary | ICD-10-CM | POA: Insufficient documentation

## 2024-08-02 NOTE — Therapy (Unsigned)
 " OUTPATIENT PHYSICAL THERAPY NEURO EVALUATION   Patient Name: Jamie Hudson MRN: [id number] DOB:12/22/1985, 39 y.o., male Today's Date: 08/02/2024   PCP: Patient, No Pcp Per  REFERRING PROVIDER:    Minor, Jonette, P        END OF SESSION:   Past Medical History:  Diagnosis Date   Asthma    Kidney stones    No past surgical history on file. Patient Active Problem List   Diagnosis Date Noted   MDD (major depressive disorder), recurrent episode, severe (HCC) 10/04/2019   Recurrent nephrolithiasis 03/09/2015    ONSET DATE: ***  REFERRING DIAG: R26.89 (ICD-10-CM) - Balance problem   THERAPY DIAG:  No diagnosis found.  Rationale for Evaluation and Treatment: Rehabilitation  SUBJECTIVE:                                                                                                                                                                                             SUBJECTIVE STATEMENT: *** Pt accompanied by: {accompnied:27141}  PERTINENT HISTORY: ***  PAIN:  Are you having pain? {OPRCPAIN:27236}  PRECAUTIONS: {Therapy precautions:24002}  RED FLAGS: {PT Red Flags:29287}   WEIGHT BEARING RESTRICTIONS: {Yes ***/No:24003}  FALLS: Has patient fallen in last 6 months? {fallsyesno:27318}  LIVING ENVIRONMENT: Lives with: {OPRC lives with:25569::lives with their family} Lives in: {Lives in:25570} Stairs: {opstairs:27293} Has following equipment at home: {Assistive devices:23999}  PLOF: {PLOF:24004}  PATIENT GOALS: ***  OBJECTIVE:  Note: Objective measures were completed at Evaluation unless otherwise noted.  DIAGNOSTIC FINDINGS: ***  COGNITION: Overall cognitive status: {cognition:24006}   SENSATION: {sensation:27233}  COORDINATION: ***  EDEMA:  {edema:24020}  MUSCLE TONE: {LE tone:25568}  MUSCLE LENGTH: Hamstrings: Right *** deg; Left *** deg Debby test: Right *** deg; Left *** deg  DTRs:  {DTR SITE:24025}  POSTURE:  {posture:25561}  LOWER EXTREMITY ROM:     {AROM/PROM:27142}  Right Eval Left Eval  Hip flexion    Hip extension    Hip abduction    Hip adduction    Hip internal rotation    Hip external rotation    Knee flexion    Knee extension    Ankle dorsiflexion    Ankle plantarflexion    Ankle inversion    Ankle eversion     (Blank rows = not tested)  LOWER EXTREMITY MMT:    MMT Right Eval Left Eval  Hip flexion    Hip extension    Hip abduction    Hip adduction    Hip internal rotation    Hip external rotation    Knee flexion    Knee extension    Ankle dorsiflexion  Ankle plantarflexion    Ankle inversion    Ankle eversion    (Blank rows = not tested)  BED MOBILITY:  {bed mobility:32615:p}  TRANSFERS: {transfers eval:32620}  RAMP:  {ramp eval:32616}  CURB:  {curb eval:32617}  STAIRS: {stairs eval:32618} GAIT: Findings: {GaitneuroPT:32644::Distance walked: ***,Comments: ***}  FUNCTIONAL TESTS:  {Functional tests:24029}  PATIENT SURVEYS:  {rehab surveys:24030}                                                                                                                              TREATMENT DATE: ***    PATIENT EDUCATION: Education details: *** Person educated: {Person educated:25204} Education method: {Education Method:25205} Education comprehension: {Education Comprehension:25206}  HOME EXERCISE PROGRAM: ***  GOALS: Goals reviewed with patient? {yes/no:20286}  SHORT TERM GOALS: Target date: ***  *** Baseline: Goal status: INITIAL    LONG TERM GOALS: Target date: ***  ***.  Patient (> 34 years old) will complete five times sit to stand test in < 15 seconds indicating an increased LE strength and improved balance. Baseline: *** Goal status: INITIAL  ***.  Patient will increase Berg Balance score by > 6 points to demonstrate decreased fall risk during functional activities. Baseline: *** Goal status: INITIAL   ***.  Patient  will reduce timed up and go to <11 seconds to reduce fall risk and demonstrate improved transfer/gait ability. Baseline: *** Goal status: INITIAL  ***.  Patient will increase 10 meter walk test to >1.75m/s as to improve gait speed for better community ambulation and to reduce fall risk. Baseline: *** Goal status: INITIAL  ***.  Patient will increase six minute walk test distance to >1000 for progression to community ambulator and improve gait ability Baseline: *** Goal status: INITIAL  ***.  Patient will increase FGA score by > 4 points to demonstrate decreased fall risk during functional activities. Baseline: *** Goal status: INITIAL   ASSESSMENT:  CLINICAL IMPRESSION: Patient is a *** y.o. *** who was seen today for physical therapy evaluation and treatment for ***.   OBJECTIVE IMPAIRMENTS: {opptimpairments:25111}.   ACTIVITY LIMITATIONS: {activitylimitations:27494}  PARTICIPATION LIMITATIONS: {participationrestrictions:25113}  PERSONAL FACTORS: {Personal factors:25162} are also affecting patient's functional outcome.   REHAB POTENTIAL: {rehabpotential:25112}  CLINICAL DECISION MAKING: {clinical decision making:25114}  EVALUATION COMPLEXITY: {Evaluation complexity:25115}  PLAN:  PT FREQUENCY: {rehab frequency:25116}  PT DURATION: {rehab duration:25117}  PLANNED INTERVENTIONS: {rehab planned interventions:25118::97110-Therapeutic exercises,97530- Therapeutic [phone]- Neuromuscular re-education,97535- Self Rjmz,02859- Manual therapy,Patient/Family education}  PLAN FOR NEXT SESSION: ***   Norman KATHEE Sharps, PT 08/02/2024, 8:20 AM        "

## 2024-08-06 ENCOUNTER — Ambulatory Visit: Admission: RE | Admit: 2024-08-06 | Source: Ambulatory Visit

## 2024-08-11 ENCOUNTER — Ambulatory Visit

## 2024-08-17 ENCOUNTER — Ambulatory Visit: Admitting: Physical Therapy

## 2024-08-25 ENCOUNTER — Ambulatory Visit: Admitting: Physical Therapy

## 2024-08-30 ENCOUNTER — Ambulatory Visit

## 2024-09-07 ENCOUNTER — Ambulatory Visit

## 2024-09-22 ENCOUNTER — Ambulatory Visit

## 2024-09-27 ENCOUNTER — Ambulatory Visit: Admitting: Physical Therapy

## 2024-10-04 ENCOUNTER — Ambulatory Visit: Admitting: Physical Therapy

## 2024-10-11 ENCOUNTER — Ambulatory Visit: Admitting: Physical Therapy

## 2024-10-18 ENCOUNTER — Ambulatory Visit: Admitting: Physical Therapy

## 2024-10-25 ENCOUNTER — Ambulatory Visit: Admitting: Physical Therapy
